# Patient Record
Sex: Female | Born: 1950 | Race: White | Hispanic: No | Marital: Married | Smoking: Former smoker
Health system: Southern US, Community
[De-identification: ages and names within clinical notes are randomized; demographics above are authoritative.]

## PROBLEM LIST (undated history)

## (undated) DIAGNOSIS — M8580 Other specified disorders of bone density and structure, unspecified site: Secondary | ICD-10-CM

## (undated) DIAGNOSIS — I10 Essential (primary) hypertension: Secondary | ICD-10-CM

## (undated) DIAGNOSIS — G43909 Migraine, unspecified, not intractable, without status migrainosus: Secondary | ICD-10-CM

## (undated) DIAGNOSIS — F32A Depression, unspecified: Secondary | ICD-10-CM

## (undated) DIAGNOSIS — H409 Unspecified glaucoma: Secondary | ICD-10-CM

## (undated) DIAGNOSIS — K219 Gastro-esophageal reflux disease without esophagitis: Secondary | ICD-10-CM

## (undated) DIAGNOSIS — E785 Hyperlipidemia, unspecified: Secondary | ICD-10-CM

## (undated) DIAGNOSIS — F329 Major depressive disorder, single episode, unspecified: Secondary | ICD-10-CM

## (undated) DIAGNOSIS — R030 Elevated blood-pressure reading, without diagnosis of hypertension: Secondary | ICD-10-CM

## (undated) DIAGNOSIS — R519 Headache, unspecified: Secondary | ICD-10-CM

## (undated) DIAGNOSIS — F419 Anxiety disorder, unspecified: Secondary | ICD-10-CM

## (undated) DIAGNOSIS — R51 Headache: Secondary | ICD-10-CM

## (undated) DIAGNOSIS — M75100 Unspecified rotator cuff tear or rupture of unspecified shoulder, not specified as traumatic: Secondary | ICD-10-CM

## (undated) DIAGNOSIS — R921 Mammographic calcification found on diagnostic imaging of breast: Secondary | ICD-10-CM

## (undated) HISTORY — DX: Major depressive disorder, single episode, unspecified: F32.9

## (undated) HISTORY — DX: Headache: R51

## (undated) HISTORY — DX: Unspecified glaucoma: H40.9

## (undated) HISTORY — PX: BREAST SURGERY: SHX581

## (undated) HISTORY — PX: FACIAL COSMETIC SURGERY: SHX629

## (undated) HISTORY — DX: Anxiety disorder, unspecified: F41.9

## (undated) HISTORY — DX: Mammographic calcification found on diagnostic imaging of breast: R92.1

## (undated) HISTORY — PX: TUBAL LIGATION: SHX77

## (undated) HISTORY — DX: Other specified disorders of bone density and structure, unspecified site: M85.80

## (undated) HISTORY — DX: Hyperlipidemia, unspecified: E78.5

## (undated) HISTORY — DX: Essential (primary) hypertension: I10

## (undated) HISTORY — DX: Gastro-esophageal reflux disease without esophagitis: K21.9

## (undated) HISTORY — DX: Headache, unspecified: R51.9

## (undated) HISTORY — DX: Unspecified rotator cuff tear or rupture of unspecified shoulder, not specified as traumatic: M75.100

## (undated) HISTORY — PX: NOSE SURGERY: SHX723

## (undated) HISTORY — DX: Migraine, unspecified, not intractable, without status migrainosus: G43.909

## (undated) HISTORY — DX: Elevated blood-pressure reading, without diagnosis of hypertension: R03.0

## (undated) HISTORY — PX: TONSILLECTOMY: SUR1361

## (undated) HISTORY — PX: RHINOPLASTY: SUR1284

## (undated) HISTORY — PX: CLAVICLE SURGERY: SHX598

## (undated) HISTORY — DX: Depression, unspecified: F32.A

---

## 1998-03-08 ENCOUNTER — Ambulatory Visit (HOSPITAL_COMMUNITY): Admission: RE | Admit: 1998-03-08 | Discharge: 1998-03-08 | Payer: Self-pay | Admitting: Gastroenterology

## 1999-01-22 HISTORY — PX: COLONOSCOPY: SHX174

## 2000-06-04 ENCOUNTER — Other Ambulatory Visit: Admission: RE | Admit: 2000-06-04 | Discharge: 2000-06-04 | Payer: Self-pay | Admitting: *Deleted

## 2001-06-09 ENCOUNTER — Other Ambulatory Visit: Admission: RE | Admit: 2001-06-09 | Discharge: 2001-06-09 | Payer: Self-pay | Admitting: *Deleted

## 2001-06-11 ENCOUNTER — Encounter: Payer: Self-pay | Admitting: *Deleted

## 2001-06-11 ENCOUNTER — Encounter: Admission: RE | Admit: 2001-06-11 | Discharge: 2001-06-11 | Payer: Self-pay | Admitting: *Deleted

## 2002-01-20 ENCOUNTER — Encounter: Admission: RE | Admit: 2002-01-20 | Discharge: 2002-01-20 | Payer: Self-pay

## 2003-08-03 ENCOUNTER — Other Ambulatory Visit: Admission: RE | Admit: 2003-08-03 | Discharge: 2003-08-03 | Payer: Self-pay | Admitting: Internal Medicine

## 2004-01-22 HISTORY — PX: COLONOSCOPY: SHX5424

## 2005-01-21 HISTORY — PX: CYSTOSCOPY: SUR368

## 2005-11-26 ENCOUNTER — Ambulatory Visit: Payer: Self-pay | Admitting: Internal Medicine

## 2005-12-06 ENCOUNTER — Ambulatory Visit: Payer: Self-pay | Admitting: Internal Medicine

## 2006-03-25 ENCOUNTER — Ambulatory Visit: Payer: Self-pay | Admitting: Internal Medicine

## 2007-03-16 ENCOUNTER — Ambulatory Visit: Payer: Self-pay | Admitting: Internal Medicine

## 2007-03-16 LAB — CONVERTED CEMR LAB
Bilirubin Urine: NEGATIVE
Blood in Urine, dipstick: NEGATIVE
Glucose, Urine, Semiquant: NEGATIVE
Ketones, urine, test strip: NEGATIVE
Nitrite: NEGATIVE
Protein, U semiquant: NEGATIVE
Specific Gravity, Urine: 1.02
Urobilinogen, UA: NEGATIVE
WBC Urine, dipstick: NEGATIVE
pH: 6.5

## 2007-03-17 ENCOUNTER — Encounter: Payer: Self-pay | Admitting: Internal Medicine

## 2007-03-20 ENCOUNTER — Encounter (INDEPENDENT_AMBULATORY_CARE_PROVIDER_SITE_OTHER): Payer: Self-pay | Admitting: *Deleted

## 2007-03-20 ENCOUNTER — Telehealth (INDEPENDENT_AMBULATORY_CARE_PROVIDER_SITE_OTHER): Payer: Self-pay | Admitting: *Deleted

## 2007-05-27 ENCOUNTER — Telehealth (INDEPENDENT_AMBULATORY_CARE_PROVIDER_SITE_OTHER): Payer: Self-pay | Admitting: *Deleted

## 2007-06-04 ENCOUNTER — Ambulatory Visit: Payer: Self-pay | Admitting: Internal Medicine

## 2007-06-04 DIAGNOSIS — M899 Disorder of bone, unspecified: Secondary | ICD-10-CM | POA: Insufficient documentation

## 2007-06-04 DIAGNOSIS — F411 Generalized anxiety disorder: Secondary | ICD-10-CM | POA: Insufficient documentation

## 2007-06-04 DIAGNOSIS — M949 Disorder of cartilage, unspecified: Secondary | ICD-10-CM

## 2007-06-05 ENCOUNTER — Encounter: Admission: RE | Admit: 2007-06-05 | Discharge: 2007-08-04 | Payer: Self-pay | Admitting: Sports Medicine

## 2007-07-02 ENCOUNTER — Ambulatory Visit: Payer: Self-pay | Admitting: Internal Medicine

## 2007-07-03 ENCOUNTER — Encounter: Payer: Self-pay | Admitting: Internal Medicine

## 2007-07-03 DIAGNOSIS — R1013 Epigastric pain: Secondary | ICD-10-CM

## 2007-07-03 DIAGNOSIS — K3189 Other diseases of stomach and duodenum: Secondary | ICD-10-CM | POA: Insufficient documentation

## 2008-06-08 ENCOUNTER — Ambulatory Visit: Payer: Self-pay | Admitting: Family Medicine

## 2008-07-27 ENCOUNTER — Encounter: Admission: RE | Admit: 2008-07-27 | Discharge: 2008-07-27 | Payer: Self-pay | Admitting: Sports Medicine

## 2008-08-30 ENCOUNTER — Telehealth (INDEPENDENT_AMBULATORY_CARE_PROVIDER_SITE_OTHER): Payer: Self-pay | Admitting: *Deleted

## 2008-10-12 ENCOUNTER — Encounter: Admission: RE | Admit: 2008-10-12 | Discharge: 2008-10-12 | Payer: Self-pay | Admitting: Obstetrics and Gynecology

## 2008-12-14 ENCOUNTER — Ambulatory Visit: Payer: Self-pay | Admitting: Internal Medicine

## 2008-12-14 DIAGNOSIS — F329 Major depressive disorder, single episode, unspecified: Secondary | ICD-10-CM | POA: Insufficient documentation

## 2008-12-14 DIAGNOSIS — M67919 Unspecified disorder of synovium and tendon, unspecified shoulder: Secondary | ICD-10-CM | POA: Insufficient documentation

## 2008-12-14 DIAGNOSIS — E785 Hyperlipidemia, unspecified: Secondary | ICD-10-CM | POA: Insufficient documentation

## 2008-12-14 DIAGNOSIS — M719 Bursopathy, unspecified: Secondary | ICD-10-CM

## 2008-12-14 DIAGNOSIS — R7309 Other abnormal glucose: Secondary | ICD-10-CM | POA: Insufficient documentation

## 2009-03-06 ENCOUNTER — Ambulatory Visit: Payer: Self-pay | Admitting: Internal Medicine

## 2009-03-06 LAB — CONVERTED CEMR LAB
Cholesterol, target level: 200 mg/dL
HDL goal, serum: 40 mg/dL
LDL Goal: 160 mg/dL

## 2010-02-11 ENCOUNTER — Encounter: Payer: Self-pay | Admitting: Internal Medicine

## 2010-02-18 LAB — CONVERTED CEMR LAB
ALT: 21 units/L (ref 0–35)
AST: 28 units/L (ref 0–37)
Albumin: 4.1 g/dL (ref 3.5–5.2)
Albumin: 4.2 g/dL (ref 3.5–5.2)
Alkaline Phosphatase: 105 units/L (ref 39–117)
Alkaline Phosphatase: 53 units/L (ref 39–117)
BUN: 17 mg/dL (ref 6–23)
Basophils Absolute: 0 10*3/uL (ref 0.0–0.1)
Basophils Absolute: 0.1 10*3/uL (ref 0.0–0.1)
Basophils Relative: 1.2 % — ABNORMAL HIGH (ref 0.0–1.0)
Bilirubin, Direct: 0 mg/dL (ref 0.0–0.3)
Bilirubin, Direct: 0.1 mg/dL (ref 0.0–0.3)
CO2: 27 meq/L (ref 19–32)
CO2: 32 meq/L (ref 19–32)
Calcium: 10.5 mg/dL (ref 8.4–10.5)
Calcium: 9.4 mg/dL (ref 8.4–10.5)
Chloride: 101 meq/L (ref 96–112)
Cholesterol: 259 mg/dL (ref 0–200)
Creatinine, Ser: 0.9 mg/dL (ref 0.4–1.2)
Creatinine, Ser: 1 mg/dL (ref 0.4–1.2)
Direct LDL: 145.9 mg/dL
Eosinophils Absolute: 0.2 10*3/uL (ref 0.0–0.7)
Eosinophils Absolute: 0.4 10*3/uL (ref 0.0–0.7)
Eosinophils Relative: 9.5 % — ABNORMAL HIGH (ref 0.0–5.0)
GFR calc Af Amer: 74 mL/min
GFR calc non Af Amer: 61 mL/min
Glucose, Bld: 115 mg/dL — ABNORMAL HIGH (ref 70–99)
Glucose, Bld: 81 mg/dL (ref 70–99)
HCT: 40 % (ref 36.0–46.0)
HDL: 82.2 mg/dL (ref >39.0)
Hemoglobin: 13.4 g/dL (ref 12.0–15.0)
Lymphocytes Relative: 37.1 % (ref 12.0–46.0)
Lymphocytes Relative: 38.4 % (ref 12.0–46.0)
MCHC: 33.5 g/dL (ref 30.0–36.0)
MCHC: 33.6 g/dL (ref 30.0–36.0)
MCV: 97.9 fL (ref 78.0–100.0)
Monocytes Absolute: 0.4 10*3/uL (ref 0.1–1.0)
Monocytes Relative: 8.6 % (ref 3.0–12.0)
Neutro Abs: 1.8 10*3/uL (ref 1.4–7.7)
Neutro Abs: 1.9 10*3/uL (ref 1.4–7.7)
Neutrophils Relative %: 43.6 % (ref 43.0–77.0)
Neutrophils Relative %: 45.1 % (ref 43.0–77.0)
Platelets: 193 10*3/uL (ref 150.0–400.0)
Platelets: 244 10*3/uL (ref 150–400)
Potassium: 4.3 meq/L (ref 3.5–5.1)
RBC: 4.08 M/uL (ref 3.87–5.11)
RDW: 12.6 % (ref 11.5–14.6)
RDW: 12.7 % (ref 11.5–14.6)
Sodium: 138 meq/L (ref 135–145)
TSH: 0.91 microintl units/mL (ref 0.35–5.50)
Total Bilirubin: 0.8 mg/dL (ref 0.3–1.2)
Total CHOL/HDL Ratio: 3.2
Total Protein: 7.3 g/dL (ref 6.0–8.3)
Triglycerides: 72 mg/dL (ref 0–149)
VLDL: 14 mg/dL (ref 0–40)
Vit D, 1,25-Dihydroxy: 65 (ref 30–89)
WBC: 4.4 10*3/uL — ABNORMAL LOW (ref 4.5–10.5)

## 2010-02-20 NOTE — Assessment & Plan Note (Signed)
Summary: TO DISCUSS HER LIPO LABS//PH   Vital Signs:  Patient profile:   60 year old female Weight:      155 pounds Pulse rate:   60 / minute Resp:     14 per minute BP sitting:   134 / 80  (left arm) Cuff size:   regular  Vitals Entered By: Shonna Chock (March 06, 2009 11:30 AM) CC: Follow-up visit: Discuss Chlosterol Profile, Lipid Management Comments REVIEWED MED LIST, PATIENT AGREED DOSE AND INSTRUCTION CORRECT    CC:  Follow-up visit: Discuss Chlosterol Profile and Lipid Management.  History of Present Illness: NMR Lipoprofile  reviewed & risk discussed. Her father, an intermittent smoker, died @ 56 of MI. She is on low carb diet. CVE as cardio  almost once daily for 40-60 min  w/o symptoms for 3 weeks.  Lipid Management History:      Positive NCEP/ATP III risk factors include female age 49 years old or older and family history for ischemic heart disease (males less than 79 years old).  Negative NCEP/ATP III risk factors include non-diabetic, HDL cholesterol greater than 60, non-tobacco-user status, non-hypertensive, no ASHD (atherosclerotic heart disease), no prior stroke/TIA, no peripheral vascular disease, and no history of aortic aneurysm.     Allergies (verified): No Known Drug Allergies  Past History:  Past Medical History: Borderline BP, PMH of  Anxiety/Depression, Dr Evelene Croon Osteopenia Fasting hyperglycemia Hyperlipidemia: NMR 11/2008: LDL 045(4098/11), HDL 100, TG 102 . LDL goal = < 120, ideally < 100. Postmenopausal  Review of Systems CV:  Denies chest pain or discomfort, leg cramps with exertion, palpitations, shortness of breath with exertion, and swelling of feet; Hand edema overnight.  Physical Exam  General:  Appears younger than age ,well-nourished, alert,appropriate and cooperative throughout examination Lungs:  Normal respiratory effort, chest expands symmetrically. Lungs are clear to auscultation, no crackles or wheezes. Heart:  Normal rate and  regular rhythm. S1 and S2 normal without gallop, murmur, click, rub. S4 Pulses:  R and L carotid,radial,dorsalis pedis and posterior tibial pulses are full and equal bilaterally Extremities:  No clubbing, cyanosis, edema.   Impression & Recommendations:  Problem # 1:  HYPERLIPIDEMIA (ICD-272.4)  Problem # 2:  ATHEROSCLEROTIC HEART DISEASE, FAMILY HX (ICD-V17.3)  Complete Medication List: 1)  Trazodone Hcl 50 Mg Tabs (Trazodone hcl) .... Take 2 tabs once daily 2)  Lorazepam 1 Mg Tabs (Lorazepam) .... 1/2 to one tab once daily as needed only 3)  Clarinex 5 Mg Tabs (Desloratadine) .Marland Kitchen.. 1 by mouth qd 4)  Deplin 7.5 Mg Tabs (L-methylfolate) .... Qd 5)  Evamist  6)  Glucosamine and Chondritin  7)  Lexapro 20 Mg Tabs (Escitalopram oxalate) .Marland Kitchen.. 1 by mouth once daily 8)  Prometrium 100 Mg Caps (Progesterone micronized)  Lipid Assessment/Plan:      Based on NCEP/ATP III, the patient's risk factor category is "0-1 risk factors".  The patient's lipid goals are as follows: Total cholesterol goal is 200; LDL cholesterol goal is 160; HDL cholesterol goal is 40; Triglyceride goal is 150.    Patient Instructions: 1)  Continue CVE ; review Dr Gildardo Griffes Eat, Drink & Be Healthy. 2)  Please schedule a follow-up appointment in 6 months. 3)   C Reactive Protein & Lipid Panel prior to visit, ICD-9:272.4, V17.3

## 2010-04-06 ENCOUNTER — Encounter: Payer: Self-pay | Admitting: Internal Medicine

## 2010-04-06 ENCOUNTER — Encounter (INDEPENDENT_AMBULATORY_CARE_PROVIDER_SITE_OTHER): Payer: BC Managed Care – PPO | Admitting: Internal Medicine

## 2010-04-06 DIAGNOSIS — M899 Disorder of bone, unspecified: Secondary | ICD-10-CM

## 2010-04-06 DIAGNOSIS — Z23 Encounter for immunization: Secondary | ICD-10-CM

## 2010-04-06 DIAGNOSIS — E785 Hyperlipidemia, unspecified: Secondary | ICD-10-CM

## 2010-04-06 DIAGNOSIS — Z0000 Encounter for general adult medical examination without abnormal findings: Secondary | ICD-10-CM

## 2010-04-06 DIAGNOSIS — Z8249 Family history of ischemic heart disease and other diseases of the circulatory system: Secondary | ICD-10-CM
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
## 2010-04-19 ENCOUNTER — Other Ambulatory Visit (INDEPENDENT_AMBULATORY_CARE_PROVIDER_SITE_OTHER): Payer: BC Managed Care – PPO

## 2010-04-19 DIAGNOSIS — Z8249 Family history of ischemic heart disease and other diseases of the circulatory system: Secondary | ICD-10-CM

## 2010-04-19 DIAGNOSIS — Z0000 Encounter for general adult medical examination without abnormal findings: Secondary | ICD-10-CM

## 2010-04-19 DIAGNOSIS — E785 Hyperlipidemia, unspecified: Secondary | ICD-10-CM

## 2010-04-19 LAB — CBC WITH DIFFERENTIAL/PLATELET
Basophils Absolute: 0 10*3/uL (ref 0.0–0.1)
Eosinophils Absolute: 0.2 10*3/uL (ref 0.0–0.7)
Eosinophils Relative: 3.1 % (ref 0.0–5.0)
MCV: 99.2 fl (ref 78.0–100.0)
Monocytes Absolute: 0.7 10*3/uL (ref 0.1–1.0)
Neutrophils Relative %: 57.1 % (ref 43.0–77.0)
Platelets: 234 10*3/uL (ref 150.0–400.0)
WBC: 6.5 10*3/uL (ref 4.5–10.5)

## 2010-05-03 ENCOUNTER — Encounter: Payer: Self-pay | Admitting: Internal Medicine

## 2010-05-26 ENCOUNTER — Encounter: Payer: Self-pay | Admitting: Internal Medicine

## 2010-05-28 ENCOUNTER — Ambulatory Visit (INDEPENDENT_AMBULATORY_CARE_PROVIDER_SITE_OTHER): Payer: BC Managed Care – PPO | Admitting: Internal Medicine

## 2010-05-28 ENCOUNTER — Encounter: Payer: Self-pay | Admitting: Internal Medicine

## 2010-05-28 VITALS — BP 122/84 | HR 80 | Wt 158.2 lb

## 2010-05-28 DIAGNOSIS — E785 Hyperlipidemia, unspecified: Secondary | ICD-10-CM

## 2010-05-28 NOTE — Progress Notes (Signed)
  Subjective:    Patient ID: Molly Hubbard, female    DOB: [birth year], 60 y.o.   MRN: [id number]  HPI Dyslipidemia assessment: Lab results: [facility] Panel reviewed : major risks are LDL 174, Lp(a) 44, increased small dense LDL.   Prior Advanced Lipid Testing: NMR Lipoprofile 2010: LDL 191(2190/98), HDL 100, TG 102. .   Family history of premature CAD/ MI: father MI @ 6, smoker . Nutrition: heart healthy .  Exercise: to start . Diabetes : no .  Smoking history  : quit age 35 .  HTN: no  .   Weight :  5# gain in past year. ROS: fatigue: no ; chest pain : no ;claudication: no; palpitations: no; abd pain/bowel changes: no ; myalgias:no;  syncope : no ; memory loss: no;skin changes: no.     Review of Systems Occasional postural head pressure.     Objective:   Physical Exam Heart:  Normal rate and regular rhythm. S1 and S2 normal without gallop, murmur, click, rub or other extra sounds.Lungs:Chest clear to auscultation; no wheezes, rhonchi,rales ,or rubs present.No increased work of breathing.  All pulses intact without  bruits .No ischemic skin changes.          Assessment & Plan:  #1 dyslipidemia; this context of premature myocardial infarction  her father  The major risk at this time his LDL greater than 130. There are minor issues related HDL; exercise program recommended  to address that. Her LP( a)  44 is a genetic risk. The only option would be niacin. This would be problematic because of flushing.  Her small dense percentage of LDL particles as increased; the elevation of the triglyceride suggests that this is more nutritional.  Plan: Options at this time would be low-dose statin or a therapeutic life style program of exercise and nutrition change (decreased high  fructose corn syrup and" heart healthy diet") with routine fasting lipids after 4-6 months.

## 2010-05-28 NOTE — Patient Instructions (Addendum)
Preventive Health Care: Exercise  30-45  minutes a day, 3-4 days a week. Walking is especially valuable in preventing Osteoporosis. Eat a low-fat diet with lots of fruits and vegetables, up to 7-9 servings per day. Avoid obesity; your goal = waist less than 35 inches.Consume less than 30 grams of sugar per day from foods & drinks with High Fructose Corn Syrup as #2,3 or #4 on label. If pavastatin taken ; check fasting labs in 10 weeks: Lipids, hepatic panel.(272.4,V17.3, 995.20)

## 2010-05-28 NOTE — Assessment & Plan Note (Signed)
NMR Lipoprofile 2010: LDL 191( 2190/98), HDL 100, TG 102.Minimal  LDL goal = < 130, ideally < 100. Father MI @ 52

## 2010-08-14 ENCOUNTER — Ambulatory Visit (INDEPENDENT_AMBULATORY_CARE_PROVIDER_SITE_OTHER): Payer: BC Managed Care – PPO | Admitting: Internal Medicine

## 2010-08-14 ENCOUNTER — Encounter: Payer: Self-pay | Admitting: Internal Medicine

## 2010-08-14 VITALS — BP 130/80 | HR 76 | Temp 98.1°F | Wt 156.2 lb

## 2010-08-14 DIAGNOSIS — R635 Abnormal weight gain: Secondary | ICD-10-CM

## 2010-08-14 DIAGNOSIS — R3915 Urgency of urination: Secondary | ICD-10-CM

## 2010-08-14 DIAGNOSIS — N390 Urinary tract infection, site not specified: Secondary | ICD-10-CM

## 2010-08-14 LAB — POCT URINALYSIS DIPSTICK
Blood, UA: NEGATIVE
Glucose, UA: NEGATIVE
Nitrite, UA: NEGATIVE
Spec Grav, UA: 1.015
Urobilinogen, UA: 0.2
pH, UA: 5

## 2010-08-14 MED ORDER — NITROFURANTOIN MONOHYD MACRO 100 MG PO CAPS: 100.0000 mg | ORAL_CAPSULE | Freq: Two times a day (BID) | ORAL | Status: AC

## 2010-08-14 NOTE — Patient Instructions (Signed)
Eat a low-fat diet with lots of fruits and vegetables, up to 7-9 servings per day. Avoid obesity; your goal is waist measurement < 40 inches.Consume less than 40 grams of sugar per day from foods & drinks with High Fructose Corn Sugar as #1,2,3 or # 4 on label. Follow the low carb nutrition program in [employer] as closely as possible to prevent Diabetes progression & complications. White carbohydrates (potatoes, rice, bread, and pasta) have a high spike of sugar and a high load of sugar. For example a  baked potato has a cup of sugar and a  french fry  2 teaspoons of sugar. Yams, wild  rice, whole grained bread &  wheat pasta have been much lower spike and load of  sugar. Portions should be the size of a deck of cards or your palm.  

## 2010-08-14 NOTE — Progress Notes (Signed)
  Subjective:    Patient ID: Molly Hubbard, female    DOB: [birth year], 60 y.o.   MRN: [id number]  HPI Urinary symptoms: Onset: 7/20 as urgency    Worsening: better with Azo   Symptoms Frequency: yes,   Hesitancy: no,   Hematuria:no Flank Pain: no  Fever: no    Nausea/Vomiting: no   Discharge: no  Red Flags  : (Risk Factors for Complicated UTI) Recent Antibiotic Usage (last 30 days): no  More than 3 UTI's last 12 months: no  PMH of  1. DM: no 2. Renal Disease/Calculi: no 3. Urinary Tract Abnormality: no  4. Instrumentation/Trauma: no 5. Immunosuppression: no      Review of Systems     Objective:   Physical Exam General appearance is one of good health and nourishment w/o distress.  Eyes: No conjunctival inflammation or scleral icterus is present.   Heart:  Normal rate and regular rhythm. S1 and S2 normal without gallop, murmur, click, rub or other extra sounds     Lungs:Chest clear to auscultation; no wheezes, rhonchi,rales ,or rubs present.No increased work of breathing.   Abdomen: bowel sounds normal, soft ; minimally tender suprapubic  Area, without masses, organomegaly or hernias noted.  No guarding or rebound . No flank tenderness; negative straight leg raising.  Skin:Warm & dry.  Intact without suspicious lesions or rashes ; no jaundice   Lymphatic: No lymphadenopathy is noted about the head, neck, axilla, or inguinal areas.                Assessment & Plan:  #1 urinary frequency; urinalysis is normal.  #2 concerns about weight; she inquired about diet pills  Plan: #1 Macrobid 100 mg twice a day pending return of the urine  culture.  #2 discuss the potential adverse effects of diet pills; she could consider [facility]. The old chart will be reviewed to assess thyroid function status

## 2010-08-14 NOTE — Progress Notes (Signed)
Addended byPecola Lawless on: 08/14/2010 04:55 PM   Modules accepted: Orders

## 2010-08-15 ENCOUNTER — Ambulatory Visit: Payer: BC Managed Care – PPO | Admitting: Internal Medicine

## 2010-08-16 LAB — URINE CULTURE: Organism ID, Bacteria: NO GROWTH

## 2010-09-03 ENCOUNTER — Other Ambulatory Visit: Payer: Self-pay | Admitting: Internal Medicine

## 2011-03-31 ENCOUNTER — Other Ambulatory Visit: Payer: Self-pay | Admitting: Internal Medicine

## 2011-04-01 ENCOUNTER — Other Ambulatory Visit: Payer: Self-pay | Admitting: Internal Medicine

## 2011-04-01 NOTE — Telephone Encounter (Signed)
Prescription sent to pharmacy.  Patient is due for labwork and office visit.

## 2011-04-01 NOTE — Telephone Encounter (Signed)
This is a duplicate request.  Already done.

## 2012-03-09 ENCOUNTER — Other Ambulatory Visit: Payer: Self-pay | Admitting: Internal Medicine

## 2012-03-10 NOTE — Telephone Encounter (Signed)
Patient needs Lipid/Hep 272.4/995.20 and OV 2-3 days later

## 2012-03-16 ENCOUNTER — Ambulatory Visit (INDEPENDENT_AMBULATORY_CARE_PROVIDER_SITE_OTHER): Payer: BC Managed Care – PPO | Admitting: Internal Medicine

## 2012-03-16 ENCOUNTER — Encounter: Payer: Self-pay | Admitting: Internal Medicine

## 2012-03-16 VITALS — BP 138/88 | HR 103 | Temp 97.4°F | Resp 18 | Ht 66.0 in | Wt 157.0 lb

## 2012-03-16 DIAGNOSIS — Z808 Family history of malignant neoplasm of other organs or systems: Secondary | ICD-10-CM | POA: Insufficient documentation

## 2012-03-16 DIAGNOSIS — E785 Hyperlipidemia, unspecified: Secondary | ICD-10-CM

## 2012-03-16 DIAGNOSIS — F329 Major depressive disorder, single episode, unspecified: Secondary | ICD-10-CM

## 2012-03-16 DIAGNOSIS — Z780 Asymptomatic menopausal state: Secondary | ICD-10-CM | POA: Insufficient documentation

## 2012-03-16 DIAGNOSIS — Z8249 Family history of ischemic heart disease and other diseases of the circulatory system: Secondary | ICD-10-CM

## 2012-03-16 DIAGNOSIS — H409 Unspecified glaucoma: Secondary | ICD-10-CM

## 2012-03-16 DIAGNOSIS — F411 Generalized anxiety disorder: Secondary | ICD-10-CM

## 2012-03-16 LAB — CBC WITH DIFFERENTIAL/PLATELET
Basophils Absolute: 0.1 10*3/uL (ref 0.0–0.1)
Basophils Relative: 2 % — ABNORMAL HIGH (ref 0–1)
Eosinophils Relative: 3 % (ref 0–5)
HCT: 39.6 % (ref 36.0–46.0)
MCH: 32.2 pg (ref 26.0–34.0)
MCHC: 33.6 g/dL (ref 30.0–36.0)
MCV: 95.9 fL (ref 78.0–100.0)
Monocytes Absolute: 0.4 10*3/uL (ref 0.1–1.0)
RDW: 13.6 % (ref 11.5–15.5)

## 2012-03-16 LAB — COMPREHENSIVE METABOLIC PANEL
AST: 42 U/L — ABNORMAL HIGH (ref 0–37)
Alkaline Phosphatase: 72 U/L (ref 39–117)
BUN: 16 mg/dL (ref 6–23)
Calcium: 10 mg/dL (ref 8.4–10.5)
Creat: 0.85 mg/dL (ref 0.50–1.10)

## 2012-03-16 LAB — LIPID PANEL
HDL: 135 mg/dL (ref >39)
Total CHOL/HDL Ratio: 2.2 Ratio
Triglycerides: 69 mg/dL (ref <150)

## 2012-03-16 LAB — TSH: TSH: 1.074 u[IU]/mL (ref 0.350–4.500)

## 2012-03-16 NOTE — Patient Instructions (Addendum)
See me as CPE
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
## 2012-03-17 ENCOUNTER — Telehealth: Payer: Self-pay | Admitting: *Deleted

## 2012-03-17 NOTE — Telephone Encounter (Signed)
Message copied by Mathews Robinsons on Tue Mar 17, 2012  8:47 AM ------      Message from: Raechel Chute D      Created: Tue Mar 17, 2012  8:39 AM       Karen Kitchens             See pts glucose.   She has new onset diabetes.   Call Orlie Pollen and tell her that her sugar is very high - give her an appt to see me WEds or Thursday            Add a HGB AIC to labs done yesterday ------

## 2012-03-17 NOTE — Telephone Encounter (Signed)
Left message awaiting return call

## 2012-03-18 ENCOUNTER — Ambulatory Visit (INDEPENDENT_AMBULATORY_CARE_PROVIDER_SITE_OTHER): Payer: BC Managed Care – PPO | Admitting: Internal Medicine

## 2012-03-18 ENCOUNTER — Encounter: Payer: Self-pay | Admitting: Internal Medicine

## 2012-03-18 VITALS — BP 140/88 | HR 99 | Temp 97.4°F | Resp 16 | Wt 157.0 lb

## 2012-03-18 DIAGNOSIS — R7309 Other abnormal glucose: Secondary | ICD-10-CM

## 2012-03-18 DIAGNOSIS — R7303 Prediabetes: Secondary | ICD-10-CM

## 2012-03-18 DIAGNOSIS — R739 Hyperglycemia, unspecified: Secondary | ICD-10-CM

## 2012-03-18 DIAGNOSIS — R7989 Other specified abnormal findings of blood chemistry: Secondary | ICD-10-CM

## 2012-03-18 LAB — HEMOGLOBIN A1C: Mean Plasma Glucose: 117 mg/dL — ABNORMAL HIGH (ref <117)

## 2012-03-18 LAB — GLUCOSE, POCT (MANUAL RESULT ENTRY): POC Glucose: 126 mg/dl — AB (ref 70–99)

## 2012-03-18 NOTE — Progress Notes (Signed)
Subjective:    Patient ID: Molly Hubbard, female    DOB: 07-08-1950, 62 y.o.   MRN: [id number]  HPI Molly Hubbard is here for follow up.  See labs.  Molly Hubbard reports thirst and increased urination "but I have always been like this"    Random fingerstick glucose is 126 in office today  Further history she reports she drinks 5-6 glasses of wine or cocktails every evening.  Her spouse does not drink she tells me.  She knows she needs to cut back. I note in new pt history she also reports using marijuana as well.   See lipids.  She tells me she is taking her Pravachol  See lfts  Likely combined ETOH and statin use  No Known Allergies Past Medical History  Diagnosis Date  . Borderline hypertension   . Anxiety   . Depression   . Hypertension   . Hyperlipidemia    Past Surgical History  Procedure Laterality Date  . Clavicle surgery      @ 19  . Nose surgery      Fractured from baseball injury   . Tubal ligation    . Colonoscopy  2006  . Colonoscopy  2001    Done for positive stool cards   . Tonsillectomy    . Cystoscopy  2007    Dr.MacDiarmid  . Breast surgery    . Facial cosmetic surgery      face lift  . Rhinoplasty     History   Social History  . Marital Status: Married    Spouse Name: N/A    Number of Children: N/A  . Years of Education: N/A   Occupational History  . Not on file.   Social History Main Topics  . Smoking status: Former Smoker    Quit date: 01/22/1988  . Smokeless tobacco: Not on file  . Alcohol Use: 0.0 oz/week    2-3 Glasses of wine per week     Comment: 2-3 daily  . Drug Use: No  . Sexually Active: Yes   Other Topics Concern  . Not on file   Social History Narrative  . No narrative on file   Family History  Problem Relation Age of Onset  . Melanoma Sister   . Stroke Mother 27  . Heart attack Father 36   Patient Active Problem List  Diagnosis  . HYPERLIPIDEMIA  . ANXIETY STATE, UNSPECIFIED  . DEPRESSION  . DYSPEPSIA  . ROTATOR CUFF  SYNDROME, LEFT  . OSTEOPENIA  . FASTING HYPERGLYCEMIA  . Glaucoma  . Menopause  . Family history of melanoma  . Family history of MI (myocardial infarction)   Current Outpatient Prescriptions on File Prior to Visit  Medication Sig Dispense Refill  . aspirin 81 MG tablet Take 81 mg by mouth 2 (two) times daily.      . Coenzyme Q10 (CO Q 10) 10 MG CAPS Take 1 tablet by mouth daily.      . Dietary Management Product (ENLYTE PO) Take 1 tablet by mouth daily.      . dorzolamide (TRUSOPT) 2 % ophthalmic solution Place 1 drop into both eyes 2 (two) times daily.      Marland Kitchen escitalopram (LEXAPRO) 20 MG tablet Take 20 mg by mouth daily.        . Estradiol (EVAMIST TD) Place onto the skin.        . fish oil-omega-3 fatty acids 1000 MG capsule Take 2 g by mouth daily.      Marland Kitchen  latanoprost (XALATAN) 0.005 % ophthalmic solution 1 drop at bedtime.      Marland Kitchen LORazepam (ATIVAN) 1 MG tablet Take 1 mg by mouth as directed. 1/2 by mouth once daily as needed       . Multiple Minerals TABS Take 1 tablet by mouth daily.      . niacin 100 MG tablet Take 100 mg by mouth daily with breakfast.      . omeprazole (PRILOSEC) 20 MG capsule Take 20 mg by mouth daily.        . pravastatin (PRAVACHOL) 20 MG tablet TAKE 1 TABLET BY MOUTH EVERY NIGHT AT BEDTIME  90 tablet  0  . pravastatin (PRAVACHOL) 20 MG tablet TAKE 1 TABLET BY MOUTH EVERY NIGHT AT BEDTIME  90 tablet  1  . progesterone (PROMETRIUM) 100 MG capsule Take 100 mg by mouth daily.        . traZODone (DESYREL) 50 MG tablet Take 50 mg by mouth as directed. Take 2 by mouth once daily       . Zinc 50 MG CAPS Take 1 capsule by mouth daily.       No current facility-administered medications on file prior to visit.       Review of Systems See HPI    Objective:   Physical Exam  Physical Exam  Nursing note and vitals reviewed.  Constitutional: She is oriented to person, place, and time. She appears well-developed and well-nourished.  HENT:  Head: Normocephalic  and atraumatic.  Cardiovascular: Normal rate and regular rhythm. Exam reveals no gallop and no friction rub.  No murmur heard.  Pulmonary/Chest: Breath sounds normal. She has no wheezes. She has no rales.  Neurological: She is alert and oriented to person, place, and time.  Skin: Skin is warm and dry.  Psychiatric: She has a normal mood and affect. Her behavior is normal.            Assessment & Plan:  Borderline diabetes. :  Pt would like to change diet before trying meds.  Long counselling session of avoiding sugars and limiting ETOH to 1 or 1.5 glasses daily.  Will refer to Diabetes center.  She is to come back for a fasting glucose and I will get AIC today  Elevated LFT  Likely combined effect of statin and ETOH  .  Counseled above  Hyperlipidemia  Will not increase statin until ETOH issues  And diabetes under control.    To return in 1-2 days for fasting glucose.  Further management based on results along with [facility] results.

## 2012-03-18 NOTE — Patient Instructions (Addendum)
To return for fasting glucose    Referred to diabetes center

## 2012-03-20 ENCOUNTER — Telehealth: Payer: Self-pay | Admitting: *Deleted

## 2012-03-20 ENCOUNTER — Encounter: Payer: Self-pay | Admitting: *Deleted

## 2012-03-20 NOTE — Telephone Encounter (Signed)
Message copied by Mathews Robinsons on Fri Mar 20, 2012  8:40 AM ------      Message from: Raechel Chute D      Created: Thu Mar 19, 2012  8:00 AM       Karen Kitchens             Call pt and let her know that her HGB AIC  Is borderline as I expected.  Counsel to be sure to keep appt at diabetes center,  Cut back on nightly alcohol (as we discussed in office) and given her an appt to see me office in 4-6 weeks.  Message back with appt time            Ok to mail labs to her ------

## 2012-03-20 NOTE — Telephone Encounter (Signed)
Left message awaiting return call

## 2012-04-07 ENCOUNTER — Other Ambulatory Visit: Payer: Self-pay | Admitting: Internal Medicine

## 2012-04-08 ENCOUNTER — Encounter: Payer: Self-pay | Admitting: Dietician

## 2012-04-08 ENCOUNTER — Encounter: Payer: BC Managed Care – PPO | Attending: Internal Medicine | Admitting: Dietician

## 2012-04-08 VITALS — Ht 66.0 in | Wt 158.6 lb

## 2012-04-08 DIAGNOSIS — Z713 Dietary counseling and surveillance: Secondary | ICD-10-CM | POA: Insufficient documentation

## 2012-04-08 DIAGNOSIS — R7309 Other abnormal glucose: Secondary | ICD-10-CM | POA: Insufficient documentation

## 2012-04-08 DIAGNOSIS — R7303 Prediabetes: Secondary | ICD-10-CM

## 2012-04-08 NOTE — Progress Notes (Signed)
Diabetes Self-Management Education  Visit Number: First/Initial  04/08/2012 Ms. Molly Hubbard, identified by name and date of birth, is a 62 y.o. female with Diabetes Type: Pre-Diabetes.  Other people present during visit:  Other (comment)   ASSESSMENT  Patient Concerns:  Nutrition/Meal planning;Monitoring;Weight Control  Height 5\' 6"  (1.676 m), weight 158 lb 9.6 oz (71.94 kg). Body mass index is 25.61 kg/(m^2).  Lab Results: LDL Cholesterol  Date Value Range Status  03/16/2012 150* 0 - 99 mg/dL Final           Total Cholesterol/HDL Ratio:CHD Risk                            Coronary Heart Disease Risk Table                                            Men       Women              1/2 Average Risk              3.4        3.3                  Average Risk              5.0        4.4               2X Average Risk              9.6        7.1               3X Average Risk             23.4       11.0     Use the calculated Patient Ratio above and the CHD Risk table      to determine the patient's CHD Risk.     ATP III Classification (LDL):           < 100        mg/dL         Optimal          100 - 129     mg/dL         Near or Above Optimal          130 - 159     mg/dL         Borderline High          160 - 189     mg/dL         High           > 190        mg/dL         Very High           Hemoglobin A1C  Date Value Range Status  03/18/2012 5.7* <5.7 % Final  According to the ADA Clinical Practice Recommendations for 2011, when     HbA1c is used as a screening test:             >=6.5%   Diagnostic of Diabetes Mellitus                (if abnormal result is confirmed)           5.7-6.4%   Increased risk of developing Diabetes Mellitus           References:Diagnosis and Classification of Diabetes Mellitus,Diabetes     Care,2011,34(Suppl 1):S62-S69 and Standards of Medical Care in             Diabetes  - 2011,Diabetes Care,2011,34 (Suppl 1):S11-S61.           Family History  Problem Relation Age of Onset  . Melanoma Sister   . Stroke Mother 47  . Heart attack Father 82   History  Substance Use Topics  . Smoking status: Former Smoker    Quit date: 01/22/1988  . Smokeless tobacco: Not on file  . Alcohol Use: 0.0 oz/week    2-3 Glasses of wine per week     Comment: 2-3 daily    Support Systems:  Spouse Special Needs:  None  Prior DM Education:   Daily Foot Exams: No Patient Belief / Attitude about Diabetes:  Diabetes can be controlled  Assessment comments: New onset pre-diabetes with A1C at 5.7 and an average blood glucose of 117 mg.  She had a fasting glucose at the time of 144 mg.  Has HDL at 135 mg and LDL at 150 mg with a Total Chol at 299 mg.  TG at 69 mg.  She has started the Wheat Bell Diet and is about losing weight and not developing full blown diabetes.       Diet Recall:   Reports that she has been doing the Wheat Belly Diet for the last 3 weeks.  Feels that she has more energy using this non grain approach.   7:00 AM: Water 2 (8 oz glasses)  Followed with 2 cups of coffee about 16 oz that has Stevia and half and half or coffee mate creamer 8:30: Uses Naked fruit juice 4 oz and adds frozen straw berries and blueberries.  9:30 Has eith a spaghetti chili or Spaghetti squash with chili or Chili with a baked sweet potato with butter and sour cream. 12:30-1:00 PM with have eggs 2 fried in olive oil or nuts or Hamberger patty with goat cheese and a sweet potato.  Will drink water with shredded frozen lemon sweetened with Evangeline Dakin  Drinks this throughout the day.  OR will have a gluten free chicken breast on a lettuce wrap. 6:30-7:00 PM will have steak or salmon and asparagus or other green vegetable and =/- a salad.    Individualized Plan for Diabetes Self-Management Training:  Continue to limit your carb intake. Plan to utilize the non-starchy veggies and the lean  proteins at all meals and snacks. Use your beans and legumes for protein and starch sources. Monitor portions of the starches you do use. Aim to increase your exercise time.  Walk the dogs further .  Education Topics Reviewed with Patient Today:  Topic Points Discussed  Disease State Definition of diabetes, type 1 and 2, and the diagnosis of diabetes  Nutrition Management Role of diet in the treatment of diabetes and the relationship between the three main macronutrients and blood glucose level;Food label reading, portion  sizes and measuring food.;Carbohydrate counting  Physical Activity and Exercise  Completed review of the importance of exercise activity for glucose control and weight loss  Medications    Monitoring    Acute Complications    Chronic Complications    Psychosocial Adjustment    Goal Setting    Preconception Care (if applicable)      PATIENTS GOALS   Learning Objective(s):     Goal The patient agrees to:  Nutrition  Continue to read labels and count carbs.    Physical Activity  increase daily physical activity.  Medications  Avoid going on medications for controlling blood glucose   Monitoring    Problem Solving    Reducing Risk    Health Coping     ______________________________________________________________________  Outcomes  Expected Outcomes:     Self-Care Barriers:      Education material provided: Living Well with Diabetes.   Resource of Calorie[employer].com web site.  Yellow card with a diet prescription of 15-30 gm of CHO per meal with 15 gm for snacks and increased use of lean protein sources.   If problems or questions, patient to contact team via:  Phone and Email  Time in: 1145     Time out: 1245  Future DSME appointment: Molly Hubbard 04/08/2012 2:31 PM

## 2012-04-14 ENCOUNTER — Other Ambulatory Visit: Payer: Self-pay | Admitting: *Deleted

## 2012-04-14 NOTE — Telephone Encounter (Signed)
Refill request

## 2012-04-16 MED ORDER — PRAVASTATIN SODIUM 20 MG PO TABS: 20.0000 mg | ORAL_TABLET | Freq: Every day | ORAL | Status: DC

## 2012-04-16 NOTE — Telephone Encounter (Signed)
Molly Hubbard regarding her Cholesterol medication and appt

## 2012-04-16 NOTE — Telephone Encounter (Signed)
Molly Hubbard  Call this pt and let her know that I refilled her Pravachol for 30 days only.  Her liver function was elevated and she needs to have it rechecked in my office.    Give her a 30 min appt and come in fasting  Message back with appt date

## 2012-05-20 ENCOUNTER — Other Ambulatory Visit: Payer: Self-pay | Admitting: Internal Medicine

## 2012-05-20 NOTE — Telephone Encounter (Signed)
Refill request

## 2012-05-21 ENCOUNTER — Encounter: Payer: Self-pay | Admitting: Internal Medicine

## 2012-05-21 DIAGNOSIS — R7989 Other specified abnormal findings of blood chemistry: Secondary | ICD-10-CM | POA: Insufficient documentation

## 2012-05-21 DIAGNOSIS — R945 Abnormal results of liver function studies: Secondary | ICD-10-CM

## 2012-05-21 NOTE — Telephone Encounter (Signed)
Notified pt about change in medication and explained need for fasting lbs before her CPE pt voiced understanding will print lab order and leave at front desk for pt to pick up

## 2012-05-21 NOTE — Telephone Encounter (Signed)
Bobbie  Call pt and let her know that I lowered her dose of Pravachol to 10 mg as her liver function tests were elevated.  I gave her 30 days worth of 10 mg.  She is to come in to office before her CPE in June to have fasting lipids,  CMP and all CPE labs done  She has a CPE appt in June

## 2012-06-10 ENCOUNTER — Telehealth: Payer: Self-pay | Admitting: *Deleted

## 2012-06-16 NOTE — Telephone Encounter (Signed)
Notified ms Lavery that mother was in our office and her assistance was needed

## 2012-07-01 ENCOUNTER — Other Ambulatory Visit: Payer: Self-pay | Admitting: *Deleted

## 2012-07-01 ENCOUNTER — Telehealth: Payer: Self-pay | Admitting: *Deleted

## 2012-07-01 DIAGNOSIS — E119 Type 2 diabetes mellitus without complications: Secondary | ICD-10-CM

## 2012-07-02 ENCOUNTER — Encounter: Payer: Self-pay | Admitting: *Deleted

## 2012-07-02 NOTE — Telephone Encounter (Signed)
error 

## 2012-07-06 ENCOUNTER — Encounter: Payer: Self-pay | Admitting: Internal Medicine

## 2012-07-06 ENCOUNTER — Ambulatory Visit (HOSPITAL_BASED_OUTPATIENT_CLINIC_OR_DEPARTMENT_OTHER)
Admission: RE | Admit: 2012-07-06 | Discharge: 2012-07-06 | Disposition: A | Discharge status: Home / Self Care | Payer: BC Managed Care – PPO | Source: Ambulatory Visit | Attending: Internal Medicine | Admitting: Internal Medicine

## 2012-07-06 ENCOUNTER — Ambulatory Visit (INDEPENDENT_AMBULATORY_CARE_PROVIDER_SITE_OTHER): Payer: BC Managed Care – PPO | Admitting: Internal Medicine

## 2012-07-06 VITALS — BP 124/79 | HR 98 | Temp 97.2°F | Resp 18 | Ht 66.0 in | Wt 150.0 lb

## 2012-07-06 DIAGNOSIS — H409 Unspecified glaucoma: Secondary | ICD-10-CM

## 2012-07-06 DIAGNOSIS — Z780 Asymptomatic menopausal state: Secondary | ICD-10-CM

## 2012-07-06 DIAGNOSIS — Z139 Encounter for screening, unspecified: Secondary | ICD-10-CM

## 2012-07-06 DIAGNOSIS — N951 Menopausal and female climacteric states: Secondary | ICD-10-CM

## 2012-07-06 DIAGNOSIS — E785 Hyperlipidemia, unspecified: Secondary | ICD-10-CM

## 2012-07-06 DIAGNOSIS — Z0000 Encounter for general adult medical examination without abnormal findings: Secondary | ICD-10-CM

## 2012-07-06 DIAGNOSIS — Z1231 Encounter for screening mammogram for malignant neoplasm of breast: Secondary | ICD-10-CM | POA: Insufficient documentation

## 2012-07-06 DIAGNOSIS — M899 Disorder of bone, unspecified: Secondary | ICD-10-CM

## 2012-07-06 DIAGNOSIS — R7989 Other specified abnormal findings of blood chemistry: Secondary | ICD-10-CM

## 2012-07-06 DIAGNOSIS — R7309 Other abnormal glucose: Secondary | ICD-10-CM

## 2012-07-06 LAB — POCT URINALYSIS DIPSTICK
Bilirubin, UA: NEGATIVE
Blood, UA: NEGATIVE
Nitrite, UA: NEGATIVE
pH, UA: 7

## 2012-07-06 NOTE — Progress Notes (Signed)
Subjective:    Patient ID: Molly Hubbard, female    DOB: 26-Jan-1950, 62 y.o.   MRN: [id number]  HPI Molly Hubbard is here for CPE   Overall doing well.    See labs    Elevated LFt's  Molly Hubbard admits to drinking 5-6 glasses of wine nightly for many years.  She also smokes marijuana to help her glaucoma.  She had seen Dr. Elmer Picker recently  Elevated glucose  Subsequent random glucoses normal.  AIC two days ago have normalized  She has not had a mm in quite some time.  Molly Hubbard does have breast implants  She has not taken her Pravachol for her elevated cholesterol.    She has an appt with her GYN  Dr. Dareen Piano for her pap later this week  No Known Allergies Past Medical History  Diagnosis Date  . Borderline hypertension   . Anxiety   . Depression   . Hypertension   . Hyperlipidemia    Past Surgical History  Procedure Laterality Date  . Clavicle surgery      @ 19  . Nose surgery      Fractured from baseball injury   . Tubal ligation    . Colonoscopy  2006  . Colonoscopy  2001    Done for positive stool cards   . Tonsillectomy    . Cystoscopy  2007    Dr.MacDiarmid  . Breast surgery    . Facial cosmetic surgery      face lift  . Rhinoplasty    . Cesarean section     History   Social History  . Marital Status: Married    Spouse Name: N/A    Number of Children: N/A  . Years of Education: N/A   Occupational History  . Not on file.   Social History Main Topics  . Smoking status: Former Smoker    Quit date: 01/22/1988  . Smokeless tobacco: Not on file  . Alcohol Use: 0.0 oz/week    2-3 Glasses of wine per week     Comment: 2-3 daily  . Drug Use: Yes    Special: Marijuana  . Sexually Active: Yes   Other Topics Concern  . Not on file   Social History Narrative  . No narrative on file   Family History  Problem Relation Age of Onset  . Melanoma Sister   . Stroke Mother 38  . Heart attack Father 59   Patient Active Problem List   Diagnosis Date Noted  .  Elevated LFTs 05/21/2012  . Glaucoma 03/16/2012  . Menopause 03/16/2012  . Family history of melanoma 03/16/2012  . Family history of MI (myocardial infarction) 03/16/2012  . HYPERLIPIDEMIA 12/14/2008  . DEPRESSION 12/14/2008  . ROTATOR CUFF SYNDROME, LEFT 12/14/2008  . FASTING HYPERGLYCEMIA 12/14/2008  . DYSPEPSIA 07/03/2007  . ANXIETY STATE, UNSPECIFIED 06/04/2007  . OSTEOPENIA 06/04/2007   Current Outpatient Prescriptions on File Prior to Visit  Medication Sig Dispense Refill  . aspirin 81 MG tablet Take 81 mg by mouth 2 (two) times daily.      . Coenzyme Q10 (CO Q 10) 10 MG CAPS Take 1 tablet by mouth daily.      Marland Kitchen escitalopram (LEXAPRO) 20 MG tablet Take 20 mg by mouth daily.        . Estradiol (EVAMIST TD) Place onto the skin.        . fish oil-omega-3 fatty acids 1000 MG capsule Take 2 g by mouth daily.      Marland Kitchen  latanoprost (XALATAN) 0.005 % ophthalmic solution 1 drop at bedtime.      . Multiple Minerals TABS Take 1 tablet by mouth daily.      . niacin 100 MG tablet Take 100 mg by mouth daily with breakfast.      . omeprazole (PRILOSEC) 20 MG capsule Take 20 mg by mouth daily.        . progesterone (PROMETRIUM) 100 MG capsule Take 100 mg by mouth daily.        . traZODone (DESYREL) 50 MG tablet Take 50 mg by mouth as directed. Take 2 by mouth once daily       . Dietary Management Product (ENLYTE PO) Take 1 tablet by mouth daily.      Marland Kitchen LORazepam (ATIVAN) 1 MG tablet Take 1 mg by mouth as directed. 1/2 by mouth once daily as needed       . pravastatin (PRAVACHOL) 20 MG tablet Take 0.5 tablets (10 mg total) by mouth daily.  30 tablet  0  . Zinc 50 MG CAPS Take 1 capsule by mouth daily.       No current facility-administered medications on file prior to visit.      Review of Systems  All other systems reviewed and are negative.       Objective:   Physical Exam Physical Exam  Nursing note and vitals reviewed.  Constitutional: She is oriented to person, place, and time.  She appears well-developed and well-nourished.  HENT:  Head: Normocephalic and atraumatic.  Right Ear: Tympanic membrane and ear canal normal. No drainage. Tympanic membrane is not injected and not erythematous.  Left Ear: Tympanic membrane and ear canal normal. No drainage. Tympanic membrane is not injected and not erythematous.  Nose: Nose normal. Right sinus exhibits no maxillary sinus tenderness and no frontal sinus tenderness. Left sinus exhibits no maxillary sinus tenderness and no frontal sinus tenderness.  Mouth/Throat: Oropharynx is clear and moist. No oral lesions. No oropharyngeal exudate.  Eyes: Conjunctivae and EOM are normal. Pupils are equal, round, and reactive to light.  Neck: Normal range of motion. Neck supple. No JVD present. Carotid bruit is not present. No mass and no thyromegaly present.  Cardiovascular: Normal rate, regular rhythm, S1 normal, S2 normal and intact distal pulses. Exam reveals no gallop and no friction rub.  No murmur heard.  Pulses:  Carotid pulses are 2+ on the right side, and 2+ on the left side.  Dorsalis pedis pulses are 2+ on the right side, and 2+ on the left side.  No carotid bruit. No LE edema  Pulmonary/Chest: Breath sounds normal. She has no wheezes. She has no rales. She exhibits no tenderness. Breast  Implants in place   No discrete masses no nipple discharge no axillary adenoapthy bilaterally Abdominal: Soft. Bowel sounds are normal. She exhibits no distension and no mass. There is no hepatosplenomegaly. There is no tenderness. There is no CVA tenderness.  Musculoskeletal: Normal range of motion.  No active synovitis to joints.  Lymphadenopathy:  She has no cervical adenopathy.  She has no axillary adenopathy.  Right: No inguinal and no supraclavicular adenopathy present.  Left: No inguinal and no supraclavicular adenopathy present.  Neurological: She is alert and oriented to person, place, and time. She has normal strength and normal  reflexes. She displays no tremor. No cranial nerve deficit or sensory deficit. Coordination and gait normal.  Skin: Skin is warm and dry. No rash noted. No cyanosis. Nails show no clubbing.  Psychiatric: She has  a normal mood and affect. Her speech is normal and behavior is normal. Cognition and memory are normal.           Assessment & Plan:  Health maintenance  Will get mm today.  Pap at GYN later this week   Fasting labs this weeek  Elevated glucose: glucoses have normalized  Advised low carb diet  Elevated LFT"S  Long discussion regarding risks of ETOH.  ADvised to reduce ETOH to 2 glasses of wine daily.  Will check lfts today.  Pt.  States  "I know I need to cut back"  Hyperlipdemia  Will check fasting  Levels.   I am hesitant  To recommend statin given ETOH use  FH melanoma  Pt had dermatologist  Glaucoma  Managed by Dr. Elmer Picker.     See me in 3 months

## 2012-07-06 NOTE — Patient Instructions (Addendum)
See me in 3 months

## 2012-07-08 ENCOUNTER — Telehealth: Payer: Self-pay | Admitting: *Deleted

## 2012-07-08 LAB — LIPID PANEL

## 2012-07-08 LAB — COMPREHENSIVE METABOLIC PANEL

## 2012-07-08 NOTE — Telephone Encounter (Signed)
LVM message to return call  

## 2012-07-10 ENCOUNTER — Other Ambulatory Visit: Payer: Self-pay | Admitting: *Deleted

## 2012-07-10 DIAGNOSIS — E785 Hyperlipidemia, unspecified: Secondary | ICD-10-CM

## 2012-07-10 LAB — COMPREHENSIVE METABOLIC PANEL
ALT: 43 U/L — ABNORMAL HIGH (ref 0–35)
Alkaline Phosphatase: 59 U/L (ref 39–117)
CO2: 26 mEq/L (ref 19–32)
Creat: 0.73 mg/dL (ref 0.50–1.10)
Sodium: 133 mEq/L — ABNORMAL LOW (ref 135–145)
Total Bilirubin: 0.6 mg/dL (ref 0.3–1.2)
Total Protein: 7 g/dL (ref 6.0–8.3)

## 2012-07-10 LAB — LIPID PANEL
HDL: 105 mg/dL (ref >39)
LDL Cholesterol: 155 mg/dL — ABNORMAL HIGH (ref 0–99)
Total CHOL/HDL Ratio: 2.6 Ratio
Triglycerides: 80 mg/dL (ref <150)

## 2012-07-15 ENCOUNTER — Telehealth: Payer: Self-pay | Admitting: *Deleted

## 2012-07-21 ENCOUNTER — Other Ambulatory Visit: Payer: Self-pay | Admitting: Internal Medicine

## 2012-07-21 ENCOUNTER — Telehealth: Payer: Self-pay | Admitting: *Deleted

## 2012-07-21 NOTE — Telephone Encounter (Signed)
LVM message for pt to return call regarding appt for labs.

## 2012-07-27 ENCOUNTER — Other Ambulatory Visit: Payer: Self-pay | Admitting: *Deleted

## 2012-07-27 NOTE — Telephone Encounter (Signed)
Refill request

## 2012-07-28 ENCOUNTER — Encounter: Payer: Self-pay | Admitting: Internal Medicine

## 2012-07-28 ENCOUNTER — Ambulatory Visit (INDEPENDENT_AMBULATORY_CARE_PROVIDER_SITE_OTHER): Payer: BC Managed Care – PPO | Admitting: Internal Medicine

## 2012-07-28 VITALS — BP 128/77 | HR 85 | Temp 97.0°F | Resp 18

## 2012-07-28 DIAGNOSIS — R7989 Other specified abnormal findings of blood chemistry: Secondary | ICD-10-CM

## 2012-07-28 DIAGNOSIS — F1290 Cannabis use, unspecified, uncomplicated: Secondary | ICD-10-CM | POA: Insufficient documentation

## 2012-07-28 DIAGNOSIS — E785 Hyperlipidemia, unspecified: Secondary | ICD-10-CM

## 2012-07-28 DIAGNOSIS — F1210 Cannabis abuse, uncomplicated: Secondary | ICD-10-CM

## 2012-07-28 DIAGNOSIS — F1020 Alcohol dependence, uncomplicated: Secondary | ICD-10-CM

## 2012-07-28 NOTE — Progress Notes (Signed)
Subjective:    Patient ID: Molly Hubbard, female    DOB: 1950-10-11, 62 y.o.   MRN: [id number]  HPI  Molly Hubbard is here for follow up.    See labs.  She tells me she drinks 4-6 glasses of "hard liquor" daily.  She also has occasional marijauana use.  She knows she need to stop driniking.  Spouse does not drink  She states she just got back from the beach where she could not play golf but "ate and drank"  No Known Allergies Past Medical History  Diagnosis Date  . Borderline hypertension   . Anxiety   . Depression   . Hypertension   . Hyperlipidemia    Past Surgical History  Procedure Laterality Date  . Clavicle surgery      @ 19  . Nose surgery      Fractured from baseball injury   . Tubal ligation    . Colonoscopy  2006  . Colonoscopy  2001    Done for positive stool cards   . Tonsillectomy    . Cystoscopy  2007    Dr.MacDiarmid  . Breast surgery    . Facial cosmetic surgery      face lift  . Rhinoplasty    . Cesarean section     History   Social History  . Marital Status: Married    Spouse Name: N/A    Number of Children: N/A  . Years of Education: N/A   Occupational History  . Not on file.   Social History Main Topics  . Smoking status: Former Smoker    Quit date: 01/22/1988  . Smokeless tobacco: Not on file  . Alcohol Use: 0.0 oz/week    2-3 Glasses of wine per week     Comment: 2-3 daily  . Drug Use: Yes    Special: Marijuana  . Sexually Active: Yes   Other Topics Concern  . Not on file   Social History Narrative  . No narrative on file   Family History  Problem Relation Age of Onset  . Melanoma Sister   . Cancer Sister     melanoma  . Stroke Mother 35  . Heart attack Father 82   Patient Active Problem List   Diagnosis Date Noted  . Alcohol dependence 07/28/2012  . Elevated LFTs 05/21/2012  . Glaucoma 03/16/2012  . Menopause 03/16/2012  . Family history of melanoma 03/16/2012  . Family history of MI (myocardial infarction)  03/16/2012  . HYPERLIPIDEMIA 12/14/2008  . DEPRESSION 12/14/2008  . ROTATOR CUFF SYNDROME, LEFT 12/14/2008  . FASTING HYPERGLYCEMIA 12/14/2008  . DYSPEPSIA 07/03/2007  . ANXIETY STATE, UNSPECIFIED 06/04/2007  . OSTEOPENIA 06/04/2007   Current Outpatient Prescriptions on File Prior to Visit  Medication Sig Dispense Refill  . aspirin 81 MG tablet Take 81 mg by mouth 2 (two) times daily.      . Coenzyme Q10 (CO Q 10) 10 MG CAPS Take 1 tablet by mouth daily.      . Dietary Management Product (ENLYTE PO) Take 1 tablet by mouth daily.      Marland Kitchen escitalopram (LEXAPRO) 20 MG tablet Take 20 mg by mouth daily.        . Estradiol (EVAMIST TD) Place onto the skin.        . fish oil-omega-3 fatty acids 1000 MG capsule Take 2 g by mouth daily.      Marland Kitchen latanoprost (XALATAN) 0.005 % ophthalmic solution 1 drop at bedtime.      Marland Kitchen  Multiple Minerals TABS Take 1 tablet by mouth daily.      . niacin 100 MG tablet Take 100 mg by mouth daily with breakfast.      . omeprazole (PRILOSEC) 20 MG capsule Take 20 mg by mouth daily.        . progesterone (PROMETRIUM) 100 MG capsule Take 100 mg by mouth daily.        . traZODone (DESYREL) 50 MG tablet Take 50 mg by mouth as directed. Take 2 by mouth once daily       . LORazepam (ATIVAN) 1 MG tablet Take 1 mg by mouth as directed. 1/2 by mouth once daily as needed        No current facility-administered medications on file prior to visit.      Review of Systems See HPI    Objective:   Physical Exam Physical Exam  Nursing note and vitals reviewed.  Constitutional: She is oriented to person, place, and time. She appears well-developed and well-nourished.  HENT:  Head: Normocephalic and atraumatic.  Cardiovascular: Normal rate and regular rhythm. Exam reveals no gallop and no friction rub.  No murmur heard.  Pulmonary/Chest: Breath sounds normal. She has no wheezes. She has no rales.  Neurological: She is alert and oriented to person, place, and time.  Skin:  Skin is warm and dry.  Psychiatric: She has a normal mood and affect. Her behavior is normal.             Assessment & Plan:  Transaminitis  Likely due to  ETOH use.  ADvised to cut back to 1 or 2 drinks daily.  I tried to compromise with pt that if she can cut back on her use,  Will recheck lfts in 3 months.  ETOH dependence see above  She also uses intermittant marijuana.  She does not wish 12 step recovery program  Hyperlipidemia:  Will daily excessive ETOH use and dependence,  Risk of statin side effects may outweigh benefit.  Will recheck in 3 months after pt is to cut back on her use and make decision then about continuing Pravachol  See me in 3 mlonths

## 2012-09-08 NOTE — Telephone Encounter (Signed)
appt made

## 2012-10-12 ENCOUNTER — Ambulatory Visit: Payer: BC Managed Care – PPO | Admitting: Internal Medicine

## 2013-02-08 ENCOUNTER — Telehealth: Payer: Self-pay | Admitting: *Deleted

## 2013-02-08 NOTE — Telephone Encounter (Signed)
Pt states that she went to have botox this am and her BP was elevated will make an appt

## 2013-02-09 ENCOUNTER — Ambulatory Visit (INDEPENDENT_AMBULATORY_CARE_PROVIDER_SITE_OTHER): Payer: 59 | Admitting: Internal Medicine

## 2013-02-09 ENCOUNTER — Encounter: Payer: Self-pay | Admitting: Internal Medicine

## 2013-02-09 VITALS — BP 137/87 | HR 80 | Temp 98.1°F | Resp 18 | Wt 159.0 lb

## 2013-02-09 DIAGNOSIS — R209 Unspecified disturbances of skin sensation: Secondary | ICD-10-CM

## 2013-02-09 DIAGNOSIS — R200 Anesthesia of skin: Secondary | ICD-10-CM

## 2013-02-09 DIAGNOSIS — IMO0001 Reserved for inherently not codable concepts without codable children: Secondary | ICD-10-CM

## 2013-02-09 DIAGNOSIS — R030 Elevated blood-pressure reading, without diagnosis of hypertension: Secondary | ICD-10-CM

## 2013-02-09 NOTE — Progress Notes (Signed)
   Subjective:    Patient ID: Molly Hubbard, female    DOB: 10/22/1950, 63 y.o.   MRN: [id number]  HPI [name] is here for acute visit.    She was getting a Botox injection yesterday and was told her BP was 180.  When checked a few minutes later SBP came down to 150 per her report  Had some ringing in ears.     No other symptoms  She was taking a lot of sudafed for a cold  Report both hands are numb in am.   No chest pain or pressure  She is on computer quite a bit   Review of Systems See HPI    Objective:   Physical Exam Physical Exam  Nursing note and vitals reviewed.  Repeat BP  130/78 Constitutional: She is oriented to person, place, and time. She appears well-developed and well-nourished.  HENT:  Head: Normocephalic and atraumatic.  Tm's  Serous effusions Cardiovascular: Normal rate and regular rhythm. Exam reveals no gallop and no friction rub.  No murmur heard.  Pulmonary/Chest: Breath sounds normal. She has no wheezes. She has no rales.  Neurological: She is alert and oriented to person, place, and time.  Skin: Skin is warm and dry. Hand  tinels and phalens negative Psychiatric: She has a normal mood and affect. Her behavior is normal.         Assessment & Plan:  Elevated BP  Likely due to sudafed.  Normal now.  Avoid decongestants  OK for plain mucinex OTC  Hand numbness may be carpal tunnel .   She does not want splints.  See me as needed

## 2013-02-09 NOTE — Patient Instructions (Addendum)
Get Mucinex  NOt D  For congestion

## 2013-04-05 ENCOUNTER — Encounter (HOSPITAL_BASED_OUTPATIENT_CLINIC_OR_DEPARTMENT_OTHER): Payer: Self-pay | Admitting: Emergency Medicine

## 2013-04-05 ENCOUNTER — Telehealth: Payer: Self-pay | Admitting: Internal Medicine

## 2013-04-05 ENCOUNTER — Emergency Department (HOSPITAL_BASED_OUTPATIENT_CLINIC_OR_DEPARTMENT_OTHER)
Admission: EM | Admit: 2013-04-05 | Discharge: 2013-04-05 | Disposition: A | Discharge status: Home / Self Care | Payer: 59 | Attending: Emergency Medicine | Admitting: Emergency Medicine

## 2013-04-05 ENCOUNTER — Telehealth: Payer: Self-pay | Admitting: *Deleted

## 2013-04-05 ENCOUNTER — Emergency Department (HOSPITAL_BASED_OUTPATIENT_CLINIC_OR_DEPARTMENT_OTHER): Payer: 59

## 2013-04-05 DIAGNOSIS — E785 Hyperlipidemia, unspecified: Secondary | ICD-10-CM | POA: Insufficient documentation

## 2013-04-05 DIAGNOSIS — Z87891 Personal history of nicotine dependence: Secondary | ICD-10-CM | POA: Insufficient documentation

## 2013-04-05 DIAGNOSIS — F411 Generalized anxiety disorder: Secondary | ICD-10-CM | POA: Insufficient documentation

## 2013-04-05 DIAGNOSIS — F329 Major depressive disorder, single episode, unspecified: Secondary | ICD-10-CM | POA: Insufficient documentation

## 2013-04-05 DIAGNOSIS — F3289 Other specified depressive episodes: Secondary | ICD-10-CM | POA: Insufficient documentation

## 2013-04-05 DIAGNOSIS — I10 Essential (primary) hypertension: Secondary | ICD-10-CM

## 2013-04-05 DIAGNOSIS — R002 Palpitations: Secondary | ICD-10-CM

## 2013-04-05 DIAGNOSIS — Z7982 Long term (current) use of aspirin: Secondary | ICD-10-CM | POA: Insufficient documentation

## 2013-04-05 DIAGNOSIS — Z79899 Other long term (current) drug therapy: Secondary | ICD-10-CM | POA: Insufficient documentation

## 2013-04-05 LAB — CBC
HEMATOCRIT: 39.5 % (ref 36.0–46.0)
HEMOGLOBIN: 13.6 g/dL (ref 12.0–15.0)
MCH: 33.7 pg (ref 26.0–34.0)
MCHC: 34.4 g/dL (ref 30.0–36.0)
MCV: 97.8 fL (ref 78.0–100.0)
Platelets: 192 10*3/uL (ref 150–400)
RBC: 4.04 MIL/uL (ref 3.87–5.11)
RDW: 13.8 % (ref 11.5–15.5)
WBC: 5.7 10*3/uL (ref 4.0–10.5)

## 2013-04-05 LAB — BASIC METABOLIC PANEL
BUN: 15 mg/dL (ref 6–23)
CALCIUM: 10.6 mg/dL — AB (ref 8.4–10.5)
CO2: 27 mEq/L (ref 19–32)
Chloride: 93 mEq/L — ABNORMAL LOW (ref 96–112)
Creatinine, Ser: 0.7 mg/dL (ref 0.50–1.10)
GFR calc Af Amer: >90 mL/min (ref >90)
Glucose, Bld: 104 mg/dL — ABNORMAL HIGH (ref 70–99)
POTASSIUM: 3.8 meq/L (ref 3.7–5.3)
Sodium: 137 mEq/L (ref 137–147)

## 2013-04-05 LAB — TROPONIN I: Troponin I: <0.3 ng/mL (ref <0.30)

## 2013-04-05 MED ORDER — DILTIAZEM HCL ER COATED BEADS 360 MG PO CP24: 360.0000 mg | ORAL_CAPSULE | Freq: Every day | ORAL | Status: DC

## 2013-04-05 NOTE — Telephone Encounter (Signed)
Anxious, feel heart beating in neck and ears when she awakes, chest feels tight, feels like her BP is high.

## 2013-04-05 NOTE — ED Notes (Signed)
Pt. Is playing on her I pad at present time with no distress noted.  Pt. Has no noted resp. Distress or shortness of breath.

## 2013-04-05 NOTE — ED Notes (Signed)
Patient transported to X-ray 

## 2013-04-05 NOTE — ED Notes (Signed)
MD at bedside discussing results. 

## 2013-04-05 NOTE — ED Provider Notes (Addendum)
CSN: [id number]     Arrival date & time 04/05/13  1148 History   First MD Initiated Contact with Patient 04/05/13 1203     Chief Complaint  Patient presents with  . Palpitations     (Consider location/radiation/quality/duration/timing/severity/associated sxs/prior Treatment) Patient is a 63 y.o. female presenting with palpitations. The history is provided by the patient.  Palpitations  patient here complaining of palpitations that have been persistent for the past 3 days that are worse in the mornings. She does drink 3 cups of coffee a day. This is her normal consumption. She also notes associated chest pressure with this. Notes associated dyspnea without diaphoresis. Symptoms have been intermittent and nothing seems to make him better worse.She denies any leg pain or swelling. Denies any cough or congestion. No syncope or near-syncope. She did use a Xanax prior to arrival because of her history of anxiety without response.  Past Medical History  Diagnosis Date  . Borderline hypertension   . Anxiety   . Depression   . Hypertension   . Hyperlipidemia    Past Surgical History  Procedure Laterality Date  . Clavicle surgery      @ 19  . Nose surgery      Fractured from baseball injury   . Tubal ligation    . Colonoscopy  2006  . Colonoscopy  2001    Done for positive stool cards   . Tonsillectomy    . Cystoscopy  2007    Dr.MacDiarmid  . Breast surgery    . Facial cosmetic surgery      face lift  . Rhinoplasty    . Cesarean section     Family History  Problem Relation Age of Onset  . Melanoma Sister   . Cancer Sister     melanoma  . Stroke Mother 108  . Heart attack Father 63   History  Substance Use Topics  . Smoking status: Former Smoker    Quit date: 01/22/1988  . Smokeless tobacco: Not on file  . Alcohol Use: 0.0 oz/week    2-3 Glasses of wine per week     Comment: 2-3 daily   OB History   Grav Para Term Preterm Abortions TAB SAB Ect Mult Living   3    1  1    2      Review of Systems  Cardiovascular: Positive for palpitations.  All other systems reviewed and are negative.      Allergies  Review of patient's allergies indicates no known allergies.  Home Medications   Current Outpatient Rx  Name  Route  Sig  Dispense  Refill  . aspirin 81 MG tablet   Oral   Take 81 mg by mouth 2 (two) times daily.         . Coenzyme Q10 (CO Q 10) 10 MG CAPS   Oral   Take 1 tablet by mouth daily.         . Dietary Management Product (ENLYTE PO)   Oral   Take 1 tablet by mouth daily.         Marland Kitchen escitalopram (LEXAPRO) 20 MG tablet   Oral   Take 20 mg by mouth daily.           . Estradiol (EVAMIST TD)   Transdermal   Place onto the skin.           . fish oil-omega-3 fatty acids 1000 MG capsule   Oral   Take 2 g by mouth daily.         Marland Kitchen  latanoprost (XALATAN) 0.005 % ophthalmic solution      1 drop at bedtime.         Marland Kitchen LORazepam (ATIVAN) 1 MG tablet   Oral   Take 1 mg by mouth as directed. 1/2 by mouth once daily as needed          . Multiple Minerals TABS   Oral   Take 1 tablet by mouth daily.         . niacin 100 MG tablet   Oral   Take 100 mg by mouth daily with breakfast.         . omeprazole (PRILOSEC) 20 MG capsule   Oral   Take 20 mg by mouth daily.           . progesterone (PROMETRIUM) 100 MG capsule   Oral   Take 100 mg by mouth daily.           . traZODone (DESYREL) 50 MG tablet   Oral   Take 50 mg by mouth as directed. Take 2 by mouth once daily           BP 158/86  Pulse 99  Temp(Src) 98.4 F (36.9 C) (Oral)  Resp 16  Ht 5\' 6"  (1.676 m)  Wt 150 lb (68.04 kg)  BMI 24.22 kg/m2  SpO2 98% Physical Exam  Nursing note and vitals reviewed. Constitutional: She is oriented to person, place, and time. She appears well-developed and well-nourished.  Non-toxic appearance. No distress.  HENT:  Head: Normocephalic and atraumatic.  Eyes: Conjunctivae, EOM and lids are normal. Pupils are  equal, round, and reactive to light.  Neck: Normal range of motion. Neck supple. No tracheal deviation present. No mass present.  Cardiovascular: Normal rate, regular rhythm and normal heart sounds.  Exam reveals no gallop.   No murmur heard. Pulmonary/Chest: Effort normal and breath sounds normal. No stridor. No respiratory distress. She has no decreased breath sounds. She has no wheezes. She has no rhonchi. She has no rales.  Abdominal: Soft. Normal appearance and bowel sounds are normal. She exhibits no distension. There is no tenderness. There is no rebound and no CVA tenderness.  Musculoskeletal: Normal range of motion. She exhibits no edema and no tenderness.  Neurological: She is alert and oriented to person, place, and time. She has normal strength. No cranial nerve deficit or sensory deficit. GCS eye subscore is 4. GCS verbal subscore is 5. GCS motor subscore is 6.  Skin: Skin is warm and dry. No abrasion and no rash noted.  Psychiatric: She has a normal mood and affect. Her speech is normal and behavior is normal.    ED Course  Procedures (including critical care time) Labs Review Labs Reviewed  TROPONIN I  CBC  BASIC METABOLIC PANEL   Imaging Review No results found.   EKG Interpretation   Date/Time:  Monday April 05 2013 11:58:18 EDT Ventricular Rate:  94 PR Interval:  150 QRS Duration: 76 QT Interval:  344 QTC Calculation: 430 R Axis:   70 Text Interpretation:  Normal sinus rhythm Right atrial enlargement  Possible Anterior infarct , age undetermined Abnormal ECG Confirmed by  Zenia Resides  MD, Serina Nichter ([id number]) on 04/05/2013 12:19:55 PM      MDM   Final diagnoses:  None   Spoke with patient's primary care physician today and discuss with her the patient's laboratory studies. Patient now states that she feels that the Xanax as she did prior to arrival has helped. She said to her chest  discomfort was persistent for several hours. Do not think that this represents ACS. No  concern for pulmonary embolism. Patient has subjective palpitations while I monitored here in the department and there was no rhythm change. Patient has mild hypertension here and she requested that the patient be placed on a Cardizem 360 mg a day. And she will see the patient in followup     Leota Jacobsen, MD 04/05/13 1411  Leota Jacobsen, MD 04/05/13 [phone]

## 2013-04-05 NOTE — Telephone Encounter (Signed)
Molly Hubbard  Call pt and give her 30 min appt at 11:00 thursday

## 2013-04-05 NOTE — Discharge Instructions (Signed)
Palpitations  A palpitation is the feeling that your heartbeat is irregular or is faster than normal. It may feel like your heart is fluttering or skipping a beat. Palpitations are usually not a serious problem. However, in some cases, you may need further medical evaluation. CAUSES  Palpitations can be caused by:  Smoking.  Caffeine or other stimulants, such as diet pills or energy drinks.  Alcohol.  Stress and anxiety.  Strenuous physical activity.  Fatigue.  Certain medicines.  Heart disease, especially if you have a history of arrhythmias. This includes atrial fibrillation, atrial flutter, or supraventricular tachycardia.  An improperly working pacemaker or defibrillator. DIAGNOSIS  To find the cause of your palpitations, your caregiver will take your history and perform a physical exam. Tests may also be done, including:  Electrocardiography (ECG). This test records the heart's electrical activity.  Cardiac monitoring. This allows your caregiver to monitor your heart rate and rhythm in real time.  Holter monitor. This is a portable device that records your heartbeat and can help diagnose heart arrhythmias. It allows your caregiver to track your heart activity for several days, if needed.  Stress tests by exercise or by giving medicine that makes the heart beat faster. TREATMENT  Treatment of palpitations depends on the cause of your symptoms and can vary greatly. Most cases of palpitations do not require any treatment other than time, relaxation, and monitoring your symptoms. Other causes, such as atrial fibrillation, atrial flutter, or supraventricular tachycardia, usually require further treatment. HOME CARE INSTRUCTIONS   Avoid:  Caffeinated coffee, tea, soft drinks, diet pills, and energy drinks.  Chocolate.  Alcohol.  Stop smoking if you smoke.  Reduce your stress and anxiety. Things that can help you relax include:  A method that measures bodily functions so  you can learn to control them (biofeedback).  Yoga.  Meditation.  Physical activity such as swimming, jogging, or walking.  Get plenty of rest and sleep. SEEK MEDICAL CARE IF:   You continue to have a fast or irregular heartbeat beyond 24 hours.  Your palpitations occur more often. SEEK IMMEDIATE MEDICAL CARE IF:  You develop chest pain or shortness of breath.  You have a severe headache.  You feel dizzy, or you faint. MAKE SURE YOU:  Understand these instructions.  Will watch your condition.  Will get help right away if you are not doing well or get worse. Document Released: 01/05/2000 Document Revised: 05/04/2012 Document Reviewed: 03/08/2011 [facility] Patient Information 2014 [place]. Hypertension As your heart beats, it forces blood through your arteries. This force is your blood pressure. If the pressure is too high, it is called hypertension (HTN) or high blood pressure. HTN is dangerous because you may have it and not know it. High blood pressure may mean that your heart has to work harder to pump blood. Your arteries may be narrow or stiff. The extra work puts you at risk for heart disease, stroke, and other problems.  Blood pressure consists of two numbers, a higher number over a lower, 110/72, for example. It is stated as "110 over 72." The ideal is below 120 for the top number (systolic) and under 80 for the bottom (diastolic). Write down your blood pressure today. You should pay close attention to your blood pressure if you have certain conditions such as:  Heart failure.  Prior heart attack.  Diabetes  Chronic kidney disease.  Prior stroke.  Multiple risk factors for heart disease. To see if you have HTN, your blood pressure  should be measured while you are seated with your arm held at the level of the heart. It should be measured at least twice. A one-time elevated blood pressure reading (especially in the Emergency Department) does not mean that you  need treatment. There may be conditions in which the blood pressure is different between your right and left arms. It is important to see your caregiver soon for a recheck. Most people have essential hypertension which means that there is not a specific cause. This type of high blood pressure may be lowered by changing lifestyle factors such as:  Stress.  Smoking.  Lack of exercise.  Excessive weight.  Drug/tobacco/alcohol use.  Eating less salt. Most people do not have symptoms from high blood pressure until it has caused damage to the body. Effective treatment can often prevent, delay or reduce that damage. TREATMENT  When a cause has been identified, treatment for high blood pressure is directed at the cause. There are a large number of medications to treat HTN. These fall into several categories, and your caregiver will help you select the medicines that are best for you. Medications may have side effects. You should review side effects with your caregiver. If your blood pressure stays high after you have made lifestyle changes or started on medicines,   Your medication(s) may need to be changed.  Other problems may need to be addressed.  Be certain you understand your prescriptions, and know how and when to take your medicine.  Be sure to follow up with your caregiver within the time frame advised (usually within two weeks) to have your blood pressure rechecked and to review your medications.  If you are taking more than one medicine to lower your blood pressure, make sure you know how and at what times they should be taken. Taking two medicines at the same time can result in blood pressure that is too low. SEEK IMMEDIATE MEDICAL CARE IF:  You develop a severe headache, blurred or changing vision, or confusion.  You have unusual weakness or numbness, or a faint feeling.  You have severe chest or abdominal pain, vomiting, or breathing problems. MAKE SURE YOU:   Understand  these instructions.  Will watch your condition.  Will get help right away if you are not doing well or get worse. Document Released: 01/07/2005 Document Revised: 04/01/2011 Document Reviewed: 08/28/2007 [facility] Patient Information 2014 [place].

## 2013-04-05 NOTE — ED Notes (Signed)
Palpitations x 3 days. Sob at times. Drinks 3 cups of coffee every am.

## 2013-04-08 ENCOUNTER — Encounter: Payer: Self-pay | Admitting: Internal Medicine

## 2013-04-08 ENCOUNTER — Ambulatory Visit (INDEPENDENT_AMBULATORY_CARE_PROVIDER_SITE_OTHER): Payer: 59 | Admitting: Internal Medicine

## 2013-04-08 VITALS — BP 151/83 | HR 90 | Temp 98.0°F | Resp 18 | Wt 155.0 lb

## 2013-04-08 DIAGNOSIS — H9319 Tinnitus, unspecified ear: Secondary | ICD-10-CM

## 2013-04-08 DIAGNOSIS — R002 Palpitations: Secondary | ICD-10-CM

## 2013-04-08 LAB — COMPREHENSIVE METABOLIC PANEL
ALT: 79 U/L — ABNORMAL HIGH (ref 0–35)
AST: 100 U/L — AB (ref 0–37)
Albumin: 4.5 g/dL (ref 3.5–5.2)
Alkaline Phosphatase: 69 U/L (ref 39–117)
BUN: 16 mg/dL (ref 6–23)
CHLORIDE: 92 meq/L — AB (ref 96–112)
CO2: 29 mEq/L (ref 19–32)
CREATININE: 0.74 mg/dL (ref 0.50–1.10)
Calcium: 10.1 mg/dL (ref 8.4–10.5)
GLUCOSE: 91 mg/dL (ref 70–99)
Potassium: 4.7 mEq/L (ref 3.5–5.3)
Sodium: 133 mEq/L — ABNORMAL LOW (ref 135–145)
Total Bilirubin: 0.6 mg/dL (ref 0.2–1.2)
Total Protein: 7.2 g/dL (ref 6.0–8.3)

## 2013-04-08 LAB — TSH: TSH: 0.703 u[IU]/mL (ref 0.350–4.500)

## 2013-04-08 MED ORDER — ATENOLOL 25 MG PO TABS: 25.0000 mg | ORAL_TABLET | Freq: Every day | ORAL | Status: DC

## 2013-04-08 NOTE — Progress Notes (Signed)
Subjective:    Patient ID: Molly Hubbard, female    DOB: [birth year], 63 y.o.   MRN: [id number]  HPI Molly Hubbard is here for ER follow up.  Evaluated in ER  3/16 for palpitations.  Bp 158/86 at that time  She also was quite anxious at that time and had taken a Xanax prior to arrival.  See ER notes.  Sister in [place] is dying of metastatic melanoma   EKG RAE insignificant q inferiorlaterally, poor R wave progression.  Pt denies pre-syncope or LOC.  NO chest pain at all.    Drinks caffeine 2-3 cups per day.  Using marijuana that she obtained from [place] and smokes twice a day to help with her glaucoma.     She additionally still has tinnitus symptoms  She has seen Dr.   Ernesto Rutherford for this but would like to   See a different ENT  No Known Allergies Past Medical History  Diagnosis Date  . Borderline hypertension   . Anxiety   . Depression   . Hypertension   . Hyperlipidemia    Past Surgical History  Procedure Laterality Date  . Clavicle surgery      @ 19  . Nose surgery      Fractured from baseball injury   . Tubal ligation    . Colonoscopy  2006  . Colonoscopy  2001    Done for positive stool cards   . Tonsillectomy    . Cystoscopy  2007    Dr.MacDiarmid  . Breast surgery    . Facial cosmetic surgery      face lift  . Rhinoplasty    . Cesarean section     History   Social History  . Marital Status: Married    Spouse Name: N/A    Number of Children: N/A  . Years of Education: N/A   Occupational History  . Not on file.   Social History Main Topics  . Smoking status: Former Smoker    Quit date: 01/22/1988  . Smokeless tobacco: Not on file  . Alcohol Use: 0.0 oz/week    2-3 Glasses of wine per week     Comment: 2-3 daily  . Drug Use: Yes    Special: Marijuana  . Sexual Activity: Yes   Other Topics Concern  . Not on file   Social History Narrative  . No narrative on file   Family History  Problem Relation Age of Onset  . Melanoma Sister   . Cancer Sister       melanoma  . Stroke Mother 8  . Heart attack Father 67   Patient Active Problem List   Diagnosis Date Noted  . Alcohol dependence 07/28/2012  . Marijuana use 07/28/2012  . Elevated LFTs 05/21/2012  . Glaucoma 03/16/2012  . Menopause 03/16/2012  . Family history of melanoma 03/16/2012  . Family history of MI (myocardial infarction) 03/16/2012  . HYPERLIPIDEMIA 12/14/2008  . DEPRESSION 12/14/2008  . ROTATOR CUFF SYNDROME, LEFT 12/14/2008  . FASTING HYPERGLYCEMIA 12/14/2008  . DYSPEPSIA 07/03/2007  . ANXIETY STATE, UNSPECIFIED 06/04/2007  . OSTEOPENIA 06/04/2007   Current Outpatient Prescriptions on File Prior to Visit  Medication Sig Dispense Refill  . aspirin 81 MG tablet Take 81 mg by mouth 2 (two) times daily.      . Dietary Management Product (ENLYTE PO) Take 1 tablet by mouth daily.      Marland Kitchen escitalopram (LEXAPRO) 20 MG tablet Take 20 mg by mouth daily.        Marland Kitchen  Estradiol (EVAMIST TD) Place onto the skin.        . fish oil-omega-3 fatty acids 1000 MG capsule Take 2 g by mouth daily.      Marland Kitchen latanoprost (XALATAN) 0.005 % ophthalmic solution 1 drop at bedtime.      Marland Kitchen LORazepam (ATIVAN) 1 MG tablet Take 1 mg by mouth as directed. 1/2 by mouth once daily as needed       . Multiple Minerals TABS Take 1 tablet by mouth daily.      . niacin 100 MG tablet Take 100 mg by mouth daily with breakfast.      . omeprazole (PRILOSEC) 20 MG capsule Take 20 mg by mouth daily.        . progesterone (PROMETRIUM) 100 MG capsule Take 100 mg by mouth daily.        . traZODone (DESYREL) 50 MG tablet Take 50 mg by mouth as directed. Take 2 by mouth once daily       . Coenzyme Q10 (CO Q 10) 10 MG CAPS Take 1 tablet by mouth daily.      Marland Kitchen diltiazem (CARDIZEM CD) 360 MG 24 hr capsule Take 1 capsule (360 mg total) by mouth daily.  30 capsule  0   No current facility-administered medications on file prior to visit.       Review of Systems See HPI    Objective:   Physical Exam  Physical Exam   Nursing note and vitals reviewed.  Constitutional: She is oriented to person, place, and time. She appears well-developed and well-nourished.  HENT:  Head: Normocephalic and atraumatic.  Cardiovascular: Normal rate and regular rhythm. Exam reveals no gallop and no friction rub.  No murmur heard.  Pulmonary/Chest: Breath sounds normal. She has no wheezes. She has no rales.  Neurological: She is alert and oriented to person, place, and time.  Skin: Skin is warm and dry.  Psychiatric: She has a normal mood and affect. Her behavior is normal.         Assessment & Plan:  Palpitations  Will check TSH today  With labs.  Will refer for possible Holter.  May be anxiety related.   ?? If marijuana a contributor    HTN  Will stop cardizem and change to beta blocker.  Atenolol 25 mg daily  See me in 3-4 weeks and titrate upward if needed   Tinnitus will refer to new ENT  Elevated CA in ER  Will rehechekc today  See me in 3-4 weeks

## 2013-04-08 NOTE — Patient Instructions (Signed)
See me in 4 weeks  30 min  Stop cardizem  In am start atenolol one daily

## 2013-04-12 ENCOUNTER — Telehealth: Payer: Self-pay | Admitting: *Deleted

## 2013-04-12 LAB — PTH, INTACT AND CALCIUM
Calcium: 10.1 mg/dL (ref 8.4–10.5)
PTH: 15.9 pg/mL (ref 14.0–72.0)

## 2013-04-12 NOTE — Telephone Encounter (Signed)
Message copied by Conley Rolls on Mon Apr 12, 2013  4:07 PM ------      Message from: Emi Belfast D      Created: Mon Apr 12, 2013  8:14 AM       Jolayne Haines            Call [place] and let her know that her liver functions tests are even higher now.  Give her 30 min appt to see me in office.  Message back with date ------

## 2013-04-12 NOTE — Telephone Encounter (Signed)
LVM messgage for pt to return call.

## 2013-04-14 ENCOUNTER — Encounter: Payer: Self-pay | Admitting: Internal Medicine

## 2013-04-14 ENCOUNTER — Ambulatory Visit (INDEPENDENT_AMBULATORY_CARE_PROVIDER_SITE_OTHER): Payer: 59 | Admitting: Internal Medicine

## 2013-04-14 ENCOUNTER — Other Ambulatory Visit: Payer: Self-pay | Admitting: Internal Medicine

## 2013-04-14 VITALS — BP 129/88 | HR 91 | Temp 98.4°F | Resp 18 | Wt 158.0 lb

## 2013-04-14 DIAGNOSIS — R945 Abnormal results of liver function studies: Principal | ICD-10-CM

## 2013-04-14 DIAGNOSIS — F1020 Alcohol dependence, uncomplicated: Secondary | ICD-10-CM

## 2013-04-14 DIAGNOSIS — N6310 Unspecified lump in the right breast, unspecified quadrant: Secondary | ICD-10-CM

## 2013-04-14 DIAGNOSIS — N63 Unspecified lump in unspecified breast: Secondary | ICD-10-CM

## 2013-04-14 DIAGNOSIS — R7989 Other specified abnormal findings of blood chemistry: Secondary | ICD-10-CM

## 2013-04-14 DIAGNOSIS — Z20828 Contact with and (suspected) exposure to other viral communicable diseases: Secondary | ICD-10-CM

## 2013-04-14 MED ORDER — OSELTAMIVIR PHOSPHATE 75 MG PO CAPS: ORAL_CAPSULE | ORAL | Status: DC

## 2013-04-14 NOTE — Patient Instructions (Signed)
Needs diagnostic mammogram R breast at breast center  Needs complete abd ultrasound for increased lfts (set up at same place if you can)   Beckey Downing counselor on [address]  Pt to call to set up appointment

## 2013-04-14 NOTE — Progress Notes (Signed)
Subjective:    Patient ID: Molly Hubbard, female    DOB: 01-11-1951, 63 y.o.   MRN: [id number]  HPI  Molly Hubbard is here for acute visit.  Exposure to friend who is pos for influenza.  Lots of nasal congestion  No documented fever  No  Cough  Muscle aches  Two weeks ago felt breast mass R nipple .  NO discharge  No FH of breast cancer.  She has bilateral implants.  MM 6/14 neg  See lft's  Molly Hubbard has known alcohol dependence and will drink  5-6 glasses of mixed ETOH most days.    She has been drinking most days and she smokes marijuana for her glaucoma.  Has a friend get marijuana from  [place]. I have counseled her to try a 12 step meeting but pt declined to attend any AA     No Known Allergies Past Medical History  Diagnosis Date  . Borderline hypertension   . Anxiety   . Depression   . Hypertension   . Hyperlipidemia    Past Surgical History  Procedure Laterality Date  . Clavicle surgery      @ 19  . Nose surgery      Fractured from baseball injury   . Tubal ligation    . Colonoscopy  2006  . Colonoscopy  2001    Done for positive stool cards   . Tonsillectomy    . Cystoscopy  2007    Dr.MacDiarmid  . Breast surgery    . Facial cosmetic surgery      face lift  . Rhinoplasty    . Cesarean section     History   Social History  . Marital Status: Married    Spouse Name: N/A    Number of Children: N/A  . Years of Education: N/A   Occupational History  . Not on file.   Social History Main Topics  . Smoking status: Former Smoker    Quit date: 01/22/1988  . Smokeless tobacco: Not on file  . Alcohol Use: 0.0 oz/week    2-3 Glasses of wine per week     Comment: 2-3 daily  . Drug Use: Yes    Special: Marijuana  . Sexual Activity: Yes   Other Topics Concern  . Not on file   Social History Narrative  . No narrative on file   Family History  Problem Relation Age of Onset  . Melanoma Sister   . Cancer Sister     melanoma  . Stroke Mother 41  . Heart attack  Father 69   Patient Active Problem List   Diagnosis Date Noted  . Alcohol dependence 07/28/2012  . Marijuana use 07/28/2012  . Elevated LFTs 05/21/2012  . Glaucoma 03/16/2012  . Menopause 03/16/2012  . Family history of melanoma 03/16/2012  . Family history of MI (myocardial infarction) 03/16/2012  . HYPERLIPIDEMIA 12/14/2008  . DEPRESSION 12/14/2008  . ROTATOR CUFF SYNDROME, LEFT 12/14/2008  . FASTING HYPERGLYCEMIA 12/14/2008  . DYSPEPSIA 07/03/2007  . ANXIETY STATE, UNSPECIFIED 06/04/2007  . OSTEOPENIA 06/04/2007   Current Outpatient Prescriptions on File Prior to Visit  Medication Sig Dispense Refill  . aspirin 81 MG tablet Take 81 mg by mouth 2 (two) times daily.      Marland Kitchen atenolol (TENORMIN) 25 MG tablet Take 1 tablet (25 mg total) by mouth daily.  30 tablet  1  . Coenzyme Q10 (CO Q 10) 10 MG CAPS Take 1 tablet by mouth daily.      Marland Kitchen  Dietary Management Product (ENLYTE PO) Take 1 tablet by mouth daily.      Marland Kitchen diltiazem (CARDIZEM CD) 360 MG 24 hr capsule Take 1 capsule (360 mg total) by mouth daily.  30 capsule  0  . escitalopram (LEXAPRO) 20 MG tablet Take 20 mg by mouth daily.        . Estradiol (EVAMIST TD) Place onto the skin.        . fish oil-omega-3 fatty acids 1000 MG capsule Take 2 g by mouth daily.      Marland Kitchen latanoprost (XALATAN) 0.005 % ophthalmic solution 1 drop at bedtime.      Marland Kitchen LORazepam (ATIVAN) 1 MG tablet Take 1 mg by mouth as directed. 1/2 by mouth once daily as needed       . Multiple Minerals TABS Take 1 tablet by mouth daily.      . niacin 100 MG tablet Take 100 mg by mouth daily with breakfast.      . omeprazole (PRILOSEC) 20 MG capsule Take 20 mg by mouth daily.        . progesterone (PROMETRIUM) 100 MG capsule Take 100 mg by mouth daily.        . traZODone (DESYREL) 50 MG tablet Take 50 mg by mouth as directed. Take 2 by mouth once daily        No current facility-administered medications on file prior to visit.      Review of Systems See HPI      Objective:   Physical Exam  Physical Exam  Nursing note and vitals reviewed.  Constitutional: She is oriented to person, place, and time. She appears well-developed and well-nourished.  HENT:  Head: Normocephalic and atraumatic.  Chest she has 2 masses near nipple 6"o'clock right Breast.   Cardiovascular: Normal rate and regular rhythm. Exam reveals no gallop and no friction rub.  No murmur heard.  Abd no HSM BS+  Non tender  Neurological: She is alert and oriented to person, place, and time.  Skin: Skin is warm and dry.  Psychiatric: She has a normal mood and affect. Her behavior is normal.         Assessment & Plan:  Elevated  Lfts'  :  Likely ETOH culprit but will get liver ultrasound.  I gave pt number to Beckey Downing substance abuse counselor - she states she will call.   She is scared and states she will cut back on ETOH  Breast mass will set up diagnostic at the breast center    Influenza exposure  Will treat with 10 day [place]

## 2013-04-19 ENCOUNTER — Ambulatory Visit: Payer: 59 | Admitting: Internal Medicine

## 2013-04-20 ENCOUNTER — Ambulatory Visit (HOSPITAL_BASED_OUTPATIENT_CLINIC_OR_DEPARTMENT_OTHER): Payer: 59

## 2013-04-21 ENCOUNTER — Ambulatory Visit
Admission: RE | Admit: 2013-04-21 | Discharge: 2013-04-21 | Disposition: A | Discharge status: Home / Self Care | Payer: 59 | Source: Ambulatory Visit | Attending: Internal Medicine | Admitting: Internal Medicine

## 2013-04-21 DIAGNOSIS — N6310 Unspecified lump in the right breast, unspecified quadrant: Secondary | ICD-10-CM

## 2013-04-25 ENCOUNTER — Telehealth: Payer: Self-pay | Admitting: Internal Medicine

## 2013-04-25 NOTE — Telephone Encounter (Signed)
[employer]  Call [place] and let her know that I have seen her breast ultrasound result and report states area is calcification around the breast implant  Give her a 30 min appt with me at end of April.  Let her know that I want to re-examine the area and recheck her LFt"S    Encourage her to make appt with Deb YOung ( and alcohol counselor) if Adah has not already done so  Message back with appt date

## 2013-04-26 NOTE — Telephone Encounter (Signed)
Notified pt of [place] results and appt made for 04/28 pt has not made an appt with councelor encouraged pt to do so

## 2013-05-05 ENCOUNTER — Ambulatory Visit: Payer: 59 | Admitting: Internal Medicine

## 2013-05-18 ENCOUNTER — Encounter: Payer: Self-pay | Admitting: Internal Medicine

## 2013-05-18 ENCOUNTER — Ambulatory Visit (INDEPENDENT_AMBULATORY_CARE_PROVIDER_SITE_OTHER): Payer: 59 | Admitting: Internal Medicine

## 2013-05-18 VITALS — BP 130/82 | HR 67 | Temp 98.2°F | Resp 18 | Wt 155.0 lb

## 2013-05-18 DIAGNOSIS — R921 Mammographic calcification found on diagnostic imaging of breast: Secondary | ICD-10-CM | POA: Insufficient documentation

## 2013-05-18 DIAGNOSIS — R7989 Other specified abnormal findings of blood chemistry: Secondary | ICD-10-CM

## 2013-05-18 DIAGNOSIS — R928 Other abnormal and inconclusive findings on diagnostic imaging of breast: Secondary | ICD-10-CM

## 2013-05-18 DIAGNOSIS — R945 Abnormal results of liver function studies: Principal | ICD-10-CM

## 2013-05-18 DIAGNOSIS — F1020 Alcohol dependence, uncomplicated: Secondary | ICD-10-CM

## 2013-05-18 NOTE — Progress Notes (Signed)
Subjective:    Patient ID: Molly Hubbard, female    DOB: 10-Feb-1950, 63 y.o.   MRN: [id number]  HPI  [place] is here for follow up.  See Dx mm  Suggest calcified scar tissue in R breast  Area has not changed in size.     She tells me she stopped "drinking   Wild [place]" and is now down to 3 glasses of wine daily.  She did not make appt with SA counselor  " I think I can do this on my own"    No Known Allergies Past Medical History  Diagnosis Date  . Borderline hypertension   . Anxiety   . Depression   . Hypertension   . Hyperlipidemia    Past Surgical History  Procedure Laterality Date  . Clavicle surgery      @ 19  . Nose surgery      Fractured from baseball injury   . Tubal ligation    . Colonoscopy  2006  . Colonoscopy  2001    Done for positive stool cards   . Tonsillectomy    . Cystoscopy  2007    Dr.MacDiarmid  . Breast surgery    . Facial cosmetic surgery      face lift  . Rhinoplasty    . Cesarean section     History   Social History  . Marital Status: Married    Spouse Name: N/A    Number of Children: N/A  . Years of Education: N/A   Occupational History  . Not on file.   Social History Main Topics  . Smoking status: Former Smoker    Quit date: 01/22/1988  . Smokeless tobacco: Not on file  . Alcohol Use: 0.0 oz/week    2-3 Glasses of wine per week     Comment: 2-3 daily  . Drug Use: Yes    Special: Marijuana  . Sexual Activity: Yes   Other Topics Concern  . Not on file   Social History Narrative  . No narrative on file   Family History  Problem Relation Age of Onset  . Melanoma Sister   . Cancer Sister     melanoma  . Stroke Mother 36  . Heart attack Father 37   Patient Active Problem List   Diagnosis Date Noted  . Breast calcification, right  DX mm 2015 05/18/2013  . Alcohol dependence 07/28/2012  . Marijuana use 07/28/2012  . Elevated LFTs 05/21/2012  . Glaucoma 03/16/2012  . Menopause 03/16/2012  . Family history of  melanoma 03/16/2012  . Family history of MI (myocardial infarction) 03/16/2012  . HYPERLIPIDEMIA 12/14/2008  . DEPRESSION 12/14/2008  . ROTATOR CUFF SYNDROME, LEFT 12/14/2008  . FASTING HYPERGLYCEMIA 12/14/2008  . DYSPEPSIA 07/03/2007  . ANXIETY STATE, UNSPECIFIED 06/04/2007  . OSTEOPENIA 06/04/2007   Current Outpatient Prescriptions on File Prior to Visit  Medication Sig Dispense Refill  . aspirin 81 MG tablet Take 81 mg by mouth 2 (two) times daily.      Marland Kitchen atenolol (TENORMIN) 25 MG tablet Take 1 tablet (25 mg total) by mouth daily.  30 tablet  1  . Coenzyme Q10 (CO Q 10) 10 MG CAPS Take 1 tablet by mouth daily.      . Dietary Management Product (ENLYTE PO) Take 1 tablet by mouth daily.      Marland Kitchen diltiazem (CARDIZEM CD) 360 MG 24 hr capsule Take 1 capsule (360 mg total) by mouth daily.  30 capsule  0  .  escitalopram (LEXAPRO) 20 MG tablet Take 20 mg by mouth daily.        . Estradiol (EVAMIST TD) Place onto the skin.        . fish oil-omega-3 fatty acids 1000 MG capsule Take 2 g by mouth daily.      Marland Kitchen latanoprost (XALATAN) 0.005 % ophthalmic solution 1 drop at bedtime.      Marland Kitchen LORazepam (ATIVAN) 1 MG tablet Take 1 mg by mouth as directed. 1/2 by mouth once daily as needed       . Multiple Minerals TABS Take 1 tablet by mouth daily.      . niacin 100 MG tablet Take 100 mg by mouth daily with breakfast.      . omeprazole (PRILOSEC) 20 MG capsule Take 20 mg by mouth daily.        Marland Kitchen oseltamivir (TAMIFLU) 75 MG capsule Take one capsule for 10 days  10 capsule  0  . progesterone (PROMETRIUM) 100 MG capsule Take 100 mg by mouth daily.        . traZODone (DESYREL) 50 MG tablet Take 50 mg by mouth as directed. Take 2 by mouth once daily        No current facility-administered medications on file prior to visit.      Review of Systems See  HPI    Objective:   Physical Exam  Physical Exam  Nursing note and vitals reviewed.  Constitutional: She is oriented to person, place, and time. She  appears well-developed and well-nourished.  HENT:  Head: Normocephalic and atraumatic.  Cardiovascular: Normal rate and regular rhythm. Exam reveals no gallop and no friction rub.  No murmur heard.  Pulmonary/Chest: Breath sounds normal. She has no wheezes. She has no rales.   Breast  Mass near nipple persists  No change in size Neurological: She is alert and oriented to person, place, and time.  Skin: Skin is warm and dry.  Psychiatric: She has a normal mood and affect. Her behavior is normal.            Assessment & Plan:  Breast calcified nodule  Pt reminded of screening mm to be done this summer  Hepatitis  Likely ETOH related  Will repeat labs.  ADvised to have 2 glasses of wine daily at maximum.  Advised to reschedule her abd ultrasound  ETOH dependence  See above.  Again advised ETOH counselor would be of great benefti

## 2013-05-18 NOTE — Patient Instructions (Signed)
See me prn

## 2013-06-02 ENCOUNTER — Institutional Professional Consult (permissible substitution): Payer: 59 | Admitting: Cardiology

## 2013-06-14 ENCOUNTER — Telehealth: Payer: Self-pay | Admitting: Internal Medicine

## 2013-06-14 NOTE — Telephone Encounter (Signed)
Molly Hubbard  Call pt and ask if she has rescheduled her abd ultrasound that was ordered in the past.  She stated she would reschedule.  She also was supposed to have repeat liver function tests at her last visit with me.  Did she get this done?  No results in chart  Message back with her response

## 2013-06-15 ENCOUNTER — Other Ambulatory Visit: Payer: Self-pay | Admitting: *Deleted

## 2013-06-15 NOTE — Telephone Encounter (Signed)
LVM message Awaiting return call

## 2013-06-15 NOTE — Telephone Encounter (Signed)
Refill request

## 2013-06-16 ENCOUNTER — Other Ambulatory Visit: Payer: Self-pay | Admitting: *Deleted

## 2013-06-16 MED ORDER — ATENOLOL 25 MG PO TABS: 25.0000 mg | ORAL_TABLET | Freq: Every day | ORAL | Status: DC

## 2013-06-21 ENCOUNTER — Other Ambulatory Visit: Payer: Self-pay | Admitting: *Deleted

## 2013-06-28 ENCOUNTER — Other Ambulatory Visit: Payer: Self-pay | Admitting: Internal Medicine

## 2013-06-28 NOTE — Telephone Encounter (Signed)
Duplicate request

## 2013-07-01 ENCOUNTER — Other Ambulatory Visit: Payer: Self-pay | Admitting: *Deleted

## 2013-07-01 MED ORDER — ATENOLOL 25 MG PO TABS: 25.0000 mg | ORAL_TABLET | Freq: Every day | ORAL | Status: DC

## 2013-07-18 ENCOUNTER — Telehealth: Payer: Self-pay | Admitting: Internal Medicine

## 2013-07-18 NOTE — Telephone Encounter (Signed)
Molly Hubbard   Call pt and inquire if she went to have her liver blood tests repeated ( I ordered back in May ) and she was supposed to have an abd ultrasound at that time  Advise to reschedule U/S , give pt phone number and have her come in for a hepatic profile and acute hepatitis panel.    Message back with pts response    thanks

## 2013-07-19 NOTE — Telephone Encounter (Signed)
Called pt to follow up on labs that were ordered 05/18/13 and to set follow up appt - [facility] for pt to call back

## 2013-07-22 ENCOUNTER — Telehealth: Payer: Self-pay | Admitting: *Deleted

## 2013-07-22 NOTE — Telephone Encounter (Signed)
Called pt about labs that were not completed on 05/18/13 and she needs a follow up appt, Left message on cell

## 2013-09-23 ENCOUNTER — Other Ambulatory Visit: Payer: Self-pay | Admitting: Internal Medicine

## 2013-09-24 NOTE — Telephone Encounter (Signed)
I routed this to you for approval/denial. Pt has not had labs completed, nor has she returned my call to make a follow up appt after two phone call attempts.   Requested Medications     Medication name:  Name from pharmacy:  atenolol (TENORMIN) 25 MG tablet  ATENOLOL 25MG  TABLETS    Sig: TAKE 1 TABLET BY MOUTH EVERY DAY    Dispense: 90 tablet Refills: 0 Start: 09/23/2013  Class: Normal    Requested on: 09/23/2013    Originally ordered on: 04/08/2013 Last refill: 08/27/2013 Order History and Details

## 2013-11-22 ENCOUNTER — Encounter: Payer: Self-pay | Admitting: Internal Medicine

## 2013-11-28 ENCOUNTER — Encounter: Payer: Self-pay | Admitting: Internal Medicine

## 2013-12-08 ENCOUNTER — Telehealth: Payer: Self-pay

## 2013-12-08 NOTE — Telephone Encounter (Signed)
Mailed Certified Letter 76/1/84

## 2013-12-20 ENCOUNTER — Other Ambulatory Visit: Payer: Self-pay | Admitting: Internal Medicine

## 2013-12-20 NOTE — Telephone Encounter (Signed)
Refill request

## 2013-12-23 NOTE — Telephone Encounter (Signed)
I spoke with Molly Hubbard and made her an appointment for 01-05-14.-eh

## 2014-01-05 ENCOUNTER — Ambulatory Visit: Payer: 59 | Admitting: Internal Medicine

## 2014-01-05 ENCOUNTER — Telehealth: Payer: Self-pay

## 2014-01-05 MED ORDER — ATENOLOL 25 MG PO TABS: 25.0000 mg | ORAL_TABLET | Freq: Every day | ORAL | Status: DC

## 2014-01-05 NOTE — Telephone Encounter (Signed)
Molly Hubbard [phone] [name] called to reschedule her appointment to after the holidays and she needs a refill on her atenolol (TENORMIN) 25 MG tablet

## 2014-01-05 NOTE — Telephone Encounter (Signed)
Pt has appt scheduled 01/24/14, are you ok with sending refill of atenolol?

## 2014-01-24 ENCOUNTER — Encounter: Payer: Self-pay | Admitting: Internal Medicine

## 2014-01-24 ENCOUNTER — Ambulatory Visit (INDEPENDENT_AMBULATORY_CARE_PROVIDER_SITE_OTHER): Payer: 59 | Admitting: Internal Medicine

## 2014-01-24 VITALS — BP 123/77 | HR 68 | Resp 16 | Ht 66.0 in | Wt 163.0 lb

## 2014-01-24 DIAGNOSIS — R945 Abnormal results of liver function studies: Secondary | ICD-10-CM

## 2014-01-24 DIAGNOSIS — Z23 Encounter for immunization: Secondary | ICD-10-CM

## 2014-01-24 DIAGNOSIS — R7989 Other specified abnormal findings of blood chemistry: Secondary | ICD-10-CM

## 2014-01-24 DIAGNOSIS — I10 Essential (primary) hypertension: Secondary | ICD-10-CM

## 2014-01-24 LAB — HEPATIC FUNCTION PANEL
ALBUMIN: 4.3 g/dL (ref 3.5–5.2)
ALT: 20 U/L (ref 0–35)
AST: 26 U/L (ref 0–37)
Alkaline Phosphatase: 64 U/L (ref 39–117)
BILIRUBIN INDIRECT: 0.5 mg/dL (ref 0.2–1.2)
Bilirubin, Direct: 0.2 mg/dL (ref 0.0–0.3)
TOTAL PROTEIN: 6.6 g/dL (ref 6.0–8.3)
Total Bilirubin: 0.7 mg/dL (ref 0.2–1.2)

## 2014-01-24 NOTE — Patient Instructions (Signed)
Schedule CPE

## 2014-01-24 NOTE — Progress Notes (Signed)
   Subjective:    Patient ID: Molly Hubbard, female    DOB: 1950-04-24, 64 y.o.   MRN: [id number]  HPI   TODAY:  [place] is here for follow up.  Doing well  Out of town for holidays   Elevated lfts/  ETOH use  She did not go for her liver ultrasound as yet  Advised to go   HTN  Tolerating meds fine  She does see Dr. Toy Care for her psychiatric meds    04/2013  Breast calcified nodule Pt reminded of screening mm to be done this summer  Hepatitis Likely ETOH related Will repeat labs. ADvised to have 2 glasses of wine daily at maximum. Advised to reschedule her abd ultrasound  ETOH dependence See above. Again advised ETOH counselor would be of great benefti    Review of Systems See HPI    Objective:   Physical Exam Physical Exam  Nursing note and vitals reviewed.  Constitutional: She is oriented to person, place, and time. She appears well-developed and well-nourished.  HENT:  Head: Normocephalic and atraumatic.  Cardiovascular: Normal rate and regular rhythm. Exam reveals no gallop and no friction rub.  No murmur heard.  Pulmonary/Chest: Breath sounds normal. She has no wheezes. She has no rales.  Neurological: She is alert and oriented to person, place, and time.  Skin: Skin is warm and dry.  Psychiatric: She has a normal mood and affect. Her behavior is normal.        Assessment & Plan:  HTN  Continue atenolol  Elevated transaminases:    wil get repeat labs,  hepatiits panel,  ANA    Again advised to seek counseleing for ETOH USE   And the multiple potential damage from excessive ETOH use   Schedule CPE

## 2014-01-25 ENCOUNTER — Telehealth: Payer: Self-pay | Admitting: *Deleted

## 2014-01-25 LAB — HEPATITIS PANEL, ACUTE
HCV Ab: NEGATIVE
HEP B C IGM: NONREACTIVE
Hep A IgM: NONREACTIVE
Hepatitis B Surface Ag: NEGATIVE

## 2014-01-25 LAB — ANA: ANA: NEGATIVE

## 2014-01-25 NOTE — Telephone Encounter (Signed)
I spoke with Sula Soda and gave her the lab results. I also mailed a copy of the labs to the patient

## 2014-01-25 NOTE — Telephone Encounter (Signed)
-----   Message from Lanice Shirts, MD sent at 01/25/2014  1:59 PM EST ----- Call pt and let her know that her liver blood tests and hepatitis are all normal  Ok ot mail labs to her

## 2014-02-03 ENCOUNTER — Other Ambulatory Visit: Payer: Self-pay | Admitting: Internal Medicine

## 2014-02-03 NOTE — Telephone Encounter (Signed)
Refill request

## 2015-03-31 ENCOUNTER — Other Ambulatory Visit: Payer: Self-pay | Admitting: Internal Medicine

## 2015-03-31 ENCOUNTER — Other Ambulatory Visit: Payer: Self-pay

## 2015-03-31 DIAGNOSIS — Z1231 Encounter for screening mammogram for malignant neoplasm of breast: Secondary | ICD-10-CM

## 2015-05-01 ENCOUNTER — Ambulatory Visit: Payer: 59

## 2015-05-29 ENCOUNTER — Ambulatory Visit: Payer: 59

## 2015-06-12 ENCOUNTER — Ambulatory Visit
Admission: RE | Admit: 2015-06-12 | Discharge: 2015-06-12 | Disposition: A | Discharge status: Home / Self Care | Payer: BLUE CROSS/BLUE SHIELD | Source: Ambulatory Visit | Attending: Internal Medicine | Admitting: Internal Medicine

## 2015-06-12 DIAGNOSIS — Z1231 Encounter for screening mammogram for malignant neoplasm of breast: Secondary | ICD-10-CM

## 2016-02-26 ENCOUNTER — Observation Stay (HOSPITAL_BASED_OUTPATIENT_CLINIC_OR_DEPARTMENT_OTHER)
Admission: EM | Admit: 2016-02-26 | Discharge: 2016-02-27 | Disposition: A | Discharge status: Home / Self Care | Payer: BLUE CROSS/BLUE SHIELD | Attending: Internal Medicine | Admitting: Internal Medicine

## 2016-02-26 ENCOUNTER — Emergency Department (HOSPITAL_BASED_OUTPATIENT_CLINIC_OR_DEPARTMENT_OTHER): Payer: BLUE CROSS/BLUE SHIELD

## 2016-02-26 ENCOUNTER — Encounter (HOSPITAL_BASED_OUTPATIENT_CLINIC_OR_DEPARTMENT_OTHER): Payer: Self-pay

## 2016-02-26 DIAGNOSIS — H409 Unspecified glaucoma: Secondary | ICD-10-CM | POA: Diagnosis not present

## 2016-02-26 DIAGNOSIS — I6522 Occlusion and stenosis of left carotid artery: Secondary | ICD-10-CM | POA: Diagnosis not present

## 2016-02-26 DIAGNOSIS — Z808 Family history of malignant neoplasm of other organs or systems: Secondary | ICD-10-CM | POA: Insufficient documentation

## 2016-02-26 DIAGNOSIS — G43109 Migraine with aura, not intractable, without status migrainosus: Secondary | ICD-10-CM | POA: Diagnosis not present

## 2016-02-26 DIAGNOSIS — R51 Headache: Secondary | ICD-10-CM

## 2016-02-26 DIAGNOSIS — Z87891 Personal history of nicotine dependence: Secondary | ICD-10-CM | POA: Diagnosis not present

## 2016-02-26 DIAGNOSIS — Z823 Family history of stroke: Secondary | ICD-10-CM | POA: Insufficient documentation

## 2016-02-26 DIAGNOSIS — R4702 Dysphasia: Secondary | ICD-10-CM | POA: Insufficient documentation

## 2016-02-26 DIAGNOSIS — F411 Generalized anxiety disorder: Secondary | ICD-10-CM | POA: Diagnosis not present

## 2016-02-26 DIAGNOSIS — R479 Unspecified speech disturbances: Secondary | ICD-10-CM

## 2016-02-26 DIAGNOSIS — Z7982 Long term (current) use of aspirin: Secondary | ICD-10-CM | POA: Insufficient documentation

## 2016-02-26 DIAGNOSIS — M75102 Unspecified rotator cuff tear or rupture of left shoulder, not specified as traumatic: Secondary | ICD-10-CM | POA: Insufficient documentation

## 2016-02-26 DIAGNOSIS — R519 Headache, unspecified: Secondary | ICD-10-CM

## 2016-02-26 DIAGNOSIS — Z79899 Other long term (current) drug therapy: Secondary | ICD-10-CM | POA: Insufficient documentation

## 2016-02-26 DIAGNOSIS — F1020 Alcohol dependence, uncomplicated: Secondary | ICD-10-CM | POA: Diagnosis not present

## 2016-02-26 DIAGNOSIS — I700 Atherosclerosis of aorta: Secondary | ICD-10-CM | POA: Diagnosis not present

## 2016-02-26 DIAGNOSIS — Z8249 Family history of ischemic heart disease and other diseases of the circulatory system: Secondary | ICD-10-CM | POA: Diagnosis not present

## 2016-02-26 DIAGNOSIS — I10 Essential (primary) hypertension: Secondary | ICD-10-CM | POA: Diagnosis not present

## 2016-02-26 DIAGNOSIS — R4781 Slurred speech: Secondary | ICD-10-CM | POA: Diagnosis not present

## 2016-02-26 DIAGNOSIS — E785 Hyperlipidemia, unspecified: Secondary | ICD-10-CM | POA: Diagnosis not present

## 2016-02-26 DIAGNOSIS — E871 Hypo-osmolality and hyponatremia: Secondary | ICD-10-CM | POA: Diagnosis not present

## 2016-02-26 DIAGNOSIS — G459 Transient cerebral ischemic attack, unspecified: Secondary | ICD-10-CM | POA: Diagnosis not present

## 2016-02-26 DIAGNOSIS — F329 Major depressive disorder, single episode, unspecified: Secondary | ICD-10-CM | POA: Insufficient documentation

## 2016-02-26 DIAGNOSIS — F1290 Cannabis use, unspecified, uncomplicated: Secondary | ICD-10-CM | POA: Diagnosis not present

## 2016-02-26 DIAGNOSIS — M8580 Other specified disorders of bone density and structure, unspecified site: Secondary | ICD-10-CM | POA: Insufficient documentation

## 2016-02-26 DIAGNOSIS — G43909 Migraine, unspecified, not intractable, without status migrainosus: Secondary | ICD-10-CM

## 2016-02-26 DIAGNOSIS — R2689 Other abnormalities of gait and mobility: Secondary | ICD-10-CM

## 2016-02-26 LAB — COMPREHENSIVE METABOLIC PANEL
ALT: 45 U/L (ref 14–54)
AST: 44 U/L — AB (ref 15–41)
Albumin: 4.7 g/dL (ref 3.5–5.0)
Alkaline Phosphatase: 56 U/L (ref 38–126)
Anion gap: 13 (ref 5–15)
BILIRUBIN TOTAL: 1 mg/dL (ref 0.3–1.2)
BUN: 9 mg/dL (ref 6–20)
CALCIUM: 9.9 mg/dL (ref 8.9–10.3)
CO2: 26 mmol/L (ref 22–32)
Chloride: 85 mmol/L — ABNORMAL LOW (ref 101–111)
Creatinine, Ser: 0.73 mg/dL (ref 0.44–1.00)
GFR calc Af Amer: >60 mL/min (ref >60)
GFR calc non Af Amer: >60 mL/min (ref >60)
Glucose, Bld: 107 mg/dL — ABNORMAL HIGH (ref 65–99)
POTASSIUM: 3.5 mmol/L (ref 3.5–5.1)
Sodium: 124 mmol/L — ABNORMAL LOW (ref 135–145)
TOTAL PROTEIN: 7.3 g/dL (ref 6.5–8.1)

## 2016-02-26 LAB — CBC
HCT: 35.3 % — ABNORMAL LOW (ref 36.0–46.0)
Hemoglobin: 12.4 g/dL (ref 12.0–15.0)
MCH: 32.9 pg (ref 26.0–34.0)
MCHC: 35.1 g/dL (ref 30.0–36.0)
MCV: 93.6 fL (ref 78.0–100.0)
PLATELETS: 206 10*3/uL (ref 150–400)
RBC: 3.77 MIL/uL — AB (ref 3.87–5.11)
RDW: 13.3 % (ref 11.5–15.5)
WBC: 5.7 10*3/uL (ref 4.0–10.5)

## 2016-02-26 LAB — URINALYSIS, ROUTINE W REFLEX MICROSCOPIC
BILIRUBIN URINE: NEGATIVE
Glucose, UA: NEGATIVE mg/dL
Hgb urine dipstick: NEGATIVE
Ketones, ur: 15 mg/dL — AB
LEUKOCYTES UA: NEGATIVE
NITRITE: NEGATIVE
PH: 6 (ref 5.0–8.0)
Protein, ur: NEGATIVE mg/dL
SPECIFIC GRAVITY, URINE: 1.005 (ref 1.005–1.030)

## 2016-02-26 LAB — CBC WITH DIFFERENTIAL/PLATELET
BASOS ABS: 0 10*3/uL (ref 0.0–0.1)
BASOS PCT: 0 %
EOS ABS: 0.1 10*3/uL (ref 0.0–0.7)
EOS PCT: 1 %
HCT: 36.5 % (ref 36.0–46.0)
Hemoglobin: 13.2 g/dL (ref 12.0–15.0)
Lymphocytes Relative: 16 %
Lymphs Abs: 0.9 10*3/uL (ref 0.7–4.0)
MCH: 33.6 pg (ref 26.0–34.0)
MCHC: 36.2 g/dL — ABNORMAL HIGH (ref 30.0–36.0)
MCV: 92.9 fL (ref 78.0–100.0)
Monocytes Absolute: 0.7 10*3/uL (ref 0.1–1.0)
Monocytes Relative: 12 %
Neutro Abs: 3.9 10*3/uL (ref 1.7–7.7)
Neutrophils Relative %: 71 %
PLATELETS: 239 10*3/uL (ref 150–400)
RBC: 3.93 MIL/uL (ref 3.87–5.11)
RDW: 12.5 % (ref 11.5–15.5)
WBC: 5.5 10*3/uL (ref 4.0–10.5)

## 2016-02-26 LAB — C-REACTIVE PROTEIN: CRP: 1 mg/dL — ABNORMAL HIGH (ref <1.0)

## 2016-02-26 LAB — RAPID URINE DRUG SCREEN, HOSP PERFORMED
Amphetamines: NOT DETECTED
Barbiturates: NOT DETECTED
Benzodiazepines: NOT DETECTED
COCAINE: NOT DETECTED
OPIATES: NOT DETECTED
Tetrahydrocannabinol: NOT DETECTED

## 2016-02-26 LAB — SODIUM, URINE, RANDOM

## 2016-02-26 LAB — SEDIMENTATION RATE: SED RATE: 4 mm/h (ref 0–22)

## 2016-02-26 LAB — OSMOLALITY, URINE: OSMOLALITY UR: 116 mosm/kg — AB (ref 300–900)

## 2016-02-26 MED ORDER — SODIUM CHLORIDE 0.9 % IV BOLUS (SEPSIS)
1000.0000 mL | Freq: Once | INTRAVENOUS | Status: AC
  Administered 2016-02-26: 1000 mL via INTRAVENOUS

## 2016-02-26 MED ORDER — ASPIRIN 325 MG PO TABS
325.0000 mg | ORAL_TABLET | Freq: Every day | ORAL | Status: DC
  Administered 2016-02-27: 325 mg via ORAL
  Filled 2016-02-26: qty 1

## 2016-02-26 MED ORDER — ASPIRIN 300 MG RE SUPP: 300.0000 mg | Freq: Every day | RECTAL | Status: DC

## 2016-02-26 MED ORDER — ACETAMINOPHEN 160 MG/5ML PO SOLN: 650.0000 mg | ORAL | Status: DC | PRN

## 2016-02-26 MED ORDER — SODIUM CHLORIDE 0.9 % IV SOLN
INTRAVENOUS | Status: DC
  Administered 2016-02-26: 23:00:00 via INTRAVENOUS

## 2016-02-26 MED ORDER — PANTOPRAZOLE SODIUM 40 MG PO TBEC
40.0000 mg | DELAYED_RELEASE_TABLET | Freq: Every day | ORAL | Status: DC
  Administered 2016-02-27: 40 mg via ORAL
  Filled 2016-02-26: qty 1

## 2016-02-26 MED ORDER — NIACIN 100 MG PO TABS
100.0000 mg | ORAL_TABLET | Freq: Every day | ORAL | Status: DC
  Administered 2016-02-27: 100 mg via ORAL
  Filled 2016-02-26: qty 1

## 2016-02-26 MED ORDER — IOPAMIDOL (ISOVUE-370) INJECTION 76%
100.0000 mL | Freq: Once | INTRAVENOUS | Status: AC | PRN
  Administered 2016-02-26: 100 mL via INTRAVENOUS

## 2016-02-26 MED ORDER — ATENOLOL 25 MG PO TABS
25.0000 mg | ORAL_TABLET | Freq: Every day | ORAL | Status: DC
Start: 2016-02-27 — End: 2016-02-27
  Administered 2016-02-27: 25 mg via ORAL
  Filled 2016-02-26: qty 1

## 2016-02-26 MED ORDER — ACETAMINOPHEN 325 MG PO TABS
650.0000 mg | ORAL_TABLET | ORAL | Status: DC | PRN
  Filled 2016-02-26: qty 2

## 2016-02-26 MED ORDER — ESCITALOPRAM OXALATE 10 MG PO TABS
20.0000 mg | ORAL_TABLET | Freq: Every day | ORAL | Status: DC
  Administered 2016-02-27: 20 mg via ORAL
  Filled 2016-02-26: qty 2

## 2016-02-26 MED ORDER — LATANOPROST 0.005 % OP SOLN
1.0000 [drp] | Freq: Every day | OPHTHALMIC | Status: DC
  Administered 2016-02-26: 1 [drp] via OPHTHALMIC
  Filled 2016-02-26: qty 2.5

## 2016-02-26 MED ORDER — ENOXAPARIN SODIUM 40 MG/0.4ML ~~LOC~~ SOLN
40.0000 mg | Freq: Every day | SUBCUTANEOUS | Status: DC
  Filled 2016-02-26: qty 0.4

## 2016-02-26 MED ORDER — LORAZEPAM 1 MG PO TABS: 1.0000 mg | ORAL_TABLET | Freq: Every day | ORAL | Status: DC | PRN

## 2016-02-26 MED ORDER — TRAZODONE HCL 100 MG PO TABS
100.0000 mg | ORAL_TABLET | Freq: Every day | ORAL | Status: DC
  Administered 2016-02-26: 100 mg via ORAL
  Filled 2016-02-26: qty 1

## 2016-02-26 MED ORDER — STROKE: EARLY STAGES OF RECOVERY BOOK
Freq: Once | Status: AC
  Administered 2016-02-26: 23:00:00

## 2016-02-26 MED ORDER — ACETAMINOPHEN 650 MG RE SUPP: 650.0000 mg | RECTAL | Status: DC | PRN

## 2016-02-26 NOTE — Progress Notes (Signed)
[facility] Admission notified of patient arrival

## 2016-02-26 NOTE — ED Provider Notes (Signed)
[facility] DEPT Provider Note   CSN: [id number] Arrival date & time: 02/26/16 1126     History    Chief Complaint  Patient presents with  . Headache     HPI Molly Hubbard is a 66 y.o. female.  66yo F w/ PMH including HTN, HLD, anx/dep who p/w Headache and speech problems. Patient states that 3 nights ago she was asleep and in the middle of the night was awakened by a headache. She reports that the headache was severe and associated with blurry vision. She took some medicine and went back to bed. That morning she woke back up and continued to have severe headache. Husband noted that she was having some speech problems with some slurred speech and confusion/disorientation. She took Tylenol and later in the afternoon took hydrocodone, which later ease of her headache. She reports a very mild headache currently but states that it has not been intense for 2 days. Her blurry vision has improved but is still not 100% normal. She denies any extremity weakness/numbness, neck pain, fevers, vomiting, cough/cold symptoms, chest pain, or shortness of breath. She has never had these symptoms before. No history of head injury recently.   Past Medical History:  Diagnosis Date  . Anxiety   . Borderline hypertension   . Depression   . Hyperlipidemia   . Hypertension      Patient Active Problem List   Diagnosis Date Noted  . Slurred speech 02/26/2016  . HTN (hypertension) 01/24/2014  . Breast calcification, right  DX mm 2015 05/18/2013  . Alcohol dependence ([place]) 07/28/2012  . Marijuana use 07/28/2012  . Elevated LFTs 05/21/2012  . Glaucoma 03/16/2012  . Menopause 03/16/2012  . Family history of melanoma 03/16/2012  . Family history of MI (myocardial infarction) 03/16/2012  . HYPERLIPIDEMIA 12/14/2008  . DEPRESSION 12/14/2008  . ROTATOR CUFF SYNDROME, LEFT 12/14/2008  . FASTING HYPERGLYCEMIA 12/14/2008  . DYSPEPSIA 07/03/2007  . ANXIETY STATE, UNSPECIFIED 06/04/2007  . OSTEOPENIA  06/04/2007    Past Surgical History:  Procedure Laterality Date  . BREAST SURGERY    . CESAREAN SECTION    . CLAVICLE SURGERY     @ 19  . COLONOSCOPY  2006  . COLONOSCOPY  2001   Done for positive stool cards   . CYSTOSCOPY  2007   Dr.MacDiarmid  . FACIAL COSMETIC SURGERY     face lift  . NOSE SURGERY     Fractured from baseball injury   . RHINOPLASTY    . TONSILLECTOMY    . TUBAL LIGATION      OB History    Gravida Para Term Preterm AB Living   3       1 2    SAB TAB Ectopic Multiple Live Births   1                Home Medications    Prior to Admission medications   Medication Sig Start Date End Date Taking? Authorizing Provider  aspirin 81 MG tablet Take 81 mg by mouth 2 (two) times daily.    Historical Provider, MD  atenolol (TENORMIN) 25 MG tablet TAKE 1 TABLET BY MOUTH DAILY 02/06/14   Lanice Shirts, MD  escitalopram (LEXAPRO) 20 MG tablet Take 20 mg by mouth daily.      Historical Provider, MD  fish oil-omega-3 fatty acids 1000 MG capsule Take 2 g by mouth daily.    Historical Provider, MD  latanoprost (XALATAN) 0.005 % ophthalmic solution 1 drop at bedtime.  Historical Provider, MD  LORazepam (ATIVAN) 1 MG tablet Take 1 mg by mouth as directed. 1/2 by mouth once daily as needed     Historical Provider, MD  Multiple Minerals TABS Take 1 tablet by mouth daily.    Historical Provider, MD  niacin 100 MG tablet Take 100 mg by mouth daily with breakfast.    Historical Provider, MD  omeprazole (PRILOSEC) 20 MG capsule Take 20 mg by mouth daily.      Historical Provider, MD  progesterone (PROMETRIUM) 100 MG capsule Take 100 mg by mouth daily.      Historical Provider, MD  traZODone (DESYREL) 50 MG tablet Take 50 mg by mouth as directed. Take 2 by mouth once daily     Historical Provider, MD      Family History  Problem Relation Age of Onset  . Melanoma Sister   . Cancer Sister     melanoma  . Stroke Mother 48  . Heart attack Father 90     Social  History  Substance Use Topics  . Smoking status: Former Smoker    Quit date: 01/22/1988  . Smokeless tobacco: Never Used  . Alcohol use Yes     Comment: daily     Allergies     Patient has no known allergies.    Review of Systems  10 Systems reviewed and are negative for acute change except as noted in the HPI.   Physical Exam Updated Vital Signs BP 125/77   Pulse 73   Temp 97.7 F (36.5 C) (Oral)   Resp 17   Ht 5\' 6"  (1.676 m)   Wt 153 lb (69.4 kg)   SpO2 96%   BMI 24.69 kg/m   Physical Exam  Constitutional: She is oriented to person, place, and time. She appears well-developed and well-nourished. No distress.  Awake, alert  HENT:  Head: Normocephalic and atraumatic.  Eyes: Conjunctivae and EOM are normal. Pupils are equal, round, and reactive to light.  Neck: Neck supple.  Cardiovascular: Normal rate, regular rhythm and normal heart sounds.   No murmur heard. Pulmonary/Chest: Effort normal and breath sounds normal. No respiratory distress.  Abdominal: Soft. Bowel sounds are normal. She exhibits no distension. There is no tenderness.  Musculoskeletal: She exhibits no edema.  Neurological: She is alert and oriented to person, place, and time. She has normal reflexes. She exhibits normal muscle tone.  Mild blurry vision L eye lateral visual field, other visual fields intact b/l; the remainder of CN exam normal Fluent speech, normal finger-to-nose testing, negative pronator drift, no clonus, normal rapid alternating movements 5/5 strength and normal sensation x all 4 extremities  Skin: Skin is warm and dry.  Psychiatric: She has a normal mood and affect. Judgment and thought content normal.  Nursing note and vitals reviewed.     ED Treatments / Results  Labs (all labs ordered are listed, but only abnormal results are displayed) Labs Reviewed  COMPREHENSIVE METABOLIC PANEL - Abnormal; Notable for the following:       Result Value   Sodium 124 (*)     Chloride 85 (*)    Glucose, Bld 107 (*)    AST 44 (*)    All other components within normal limits  CBC WITH DIFFERENTIAL/PLATELET - Abnormal; Notable for the following:    MCHC 36.2 (*)    All other components within normal limits  URINALYSIS, ROUTINE W REFLEX MICROSCOPIC - Abnormal; Notable for the following:    Ketones, ur 15 (*)  All other components within normal limits     EKG  EKG Interpretation  Date/Time:  Monday February 26 2016 12:46:01 EST Ventricular Rate:  84 PR Interval:    QRS Duration: 90 QT Interval:  373 QTC Calculation: 441 R Axis:   60 Text Interpretation:  Sinus rhythm Right atrial enlargement Probable anteroseptal infarct, old No significant change since last tracing Confirmed by Moishe Schellenberg MD, Letesha Klecker [phone]) on 02/26/2016 12:48:42 PM         Radiology Ct Angio Head W/cm &/or Wo Cm  Result Date: 02/26/2016 CLINICAL DATA:  Severe headache and speech disturbance over the last 3 days. EXAM: CT ANGIOGRAPHY HEAD AND NECK TECHNIQUE: Multidetector CT imaging of the head and neck was performed using the standard protocol during bolus administration of intravenous contrast. Multiplanar CT image reconstructions and MIPs were obtained to evaluate the vascular anatomy. Carotid stenosis measurements (when applicable) are obtained utilizing NASCET criteria, using the distal internal carotid diameter as the denominator. CONTRAST:  100 cc Isovue 370 COMPARISON:  Head CT earlier same day FINDINGS: CTA NECK FINDINGS Aortic arch: Atherosclerosis of the aortic arch. Branching pattern is normal, with the exception of a small left vertebral artery arising directly from the arch. Right carotid system: Common carotid artery widely patent to the bifurcation. Atherosclerotic calcification at the carotid bifurcation but without stenosis. Cervical ICA is tortuous but widely patent. Left carotid system: Common carotid artery widely patent to the bifurcation. Atherosclerotic disease at the  bifurcation and proximal ICA. 40% stenosis of the proximal ICA. Soft plaque without visible ulceration. Beyond the bulb, the ICA is tortuous but widely patent. Vertebral arteries: Right vertebral artery is dominant. Right vertebral artery origin is widely patent. The vessel is widely patent through the cervical region. As noted above, small left vertebral artery takes origin from the arch and is patent through the cervical region. Skeleton: Ordinary cervical spondylosis. Other neck: No significant finding. Upper chest: Normal Review of the MIP images confirms the above findings CTA HEAD FINDINGS Anterior circulation: Both internal carotid arteries are patent through the skullbase and siphon regions. No stenosis. Anterior and middle cerebral vessels are patent without proximal stenosis, aneurysm or vascular malformation. Posterior circulation: Left vertebral artery terminates in PICA. Right vertebral artery supplies basilar. No basilar stenosis. Posterior circulation branch vessels are normal. Venous sinuses: Patent and normal Anatomic variants: None significant Delayed phase: No abnormal enhancement Review of the MIP images confirms the above findings IMPRESSION: Atherosclerotic disease at both carotid bifurcations. No stenosis on the right. 40% stenosis of the proximal ICA on the left, with soft plaque that could be at greater risk of rupture. No intracranial anterior circulation disease. No posterior circulation disease. Left vertebral artery is a tiny vessel that originates from the arch. No lesions seen to explain headache or speech disturbance. Electronically Signed   By: Nelson Chimes M.D.   On: 02/26/2016 14:05   Ct Head Wo Contrast  Result Date: 02/26/2016 CLINICAL DATA:  Severe parietal headache and pressure since 02/24/2016, residual pain, visual disturbance, hallucinations, slurred speech, unable to make complete sentences, history hypertension EXAM: CT HEAD WITHOUT CONTRAST TECHNIQUE: Contiguous axial  images were obtained from the base of the skull through the vertex without intravenous contrast. Sagittal and coronal MPR images reconstructed from axial data set. COMPARISON:  None FINDINGS: Brain: Generalized atrophy. Normal ventricular morphology. No midline shift or mass effect. Mild small vessel chronic ischemic changes of deep cerebral white matter. No intracranial hemorrhage, mass lesion, or evidence acute infarction. No extra-axial  fluid collections. Vascular: Normal appearance Skull: Intact Sinuses/Orbits: Cleared Other: N/A IMPRESSION: Atrophy with mild small vessel chronic ischemic changes of deep cerebral white matter. No acute intracranial abnormalities. Electronically Signed   By: Lavonia Dana M.D.   On: 02/26/2016 12:04   Ct Angio Neck W Or Wo Contrast  Result Date: 02/26/2016 CLINICAL DATA:  Severe headache and speech disturbance over the last 3 days. EXAM: CT ANGIOGRAPHY HEAD AND NECK TECHNIQUE: Multidetector CT imaging of the head and neck was performed using the standard protocol during bolus administration of intravenous contrast. Multiplanar CT image reconstructions and MIPs were obtained to evaluate the vascular anatomy. Carotid stenosis measurements (when applicable) are obtained utilizing NASCET criteria, using the distal internal carotid diameter as the denominator. CONTRAST:  100 cc Isovue 370 COMPARISON:  Head CT earlier same day FINDINGS: CTA NECK FINDINGS Aortic arch: Atherosclerosis of the aortic arch. Branching pattern is normal, with the exception of a small left vertebral artery arising directly from the arch. Right carotid system: Common carotid artery widely patent to the bifurcation. Atherosclerotic calcification at the carotid bifurcation but without stenosis. Cervical ICA is tortuous but widely patent. Left carotid system: Common carotid artery widely patent to the bifurcation. Atherosclerotic disease at the bifurcation and proximal ICA. 40% stenosis of the proximal ICA. Soft  plaque without visible ulceration. Beyond the bulb, the ICA is tortuous but widely patent. Vertebral arteries: Right vertebral artery is dominant. Right vertebral artery origin is widely patent. The vessel is widely patent through the cervical region. As noted above, small left vertebral artery takes origin from the arch and is patent through the cervical region. Skeleton: Ordinary cervical spondylosis. Other neck: No significant finding. Upper chest: Normal Review of the MIP images confirms the above findings CTA HEAD FINDINGS Anterior circulation: Both internal carotid arteries are patent through the skullbase and siphon regions. No stenosis. Anterior and middle cerebral vessels are patent without proximal stenosis, aneurysm or vascular malformation. Posterior circulation: Left vertebral artery terminates in PICA. Right vertebral artery supplies basilar. No basilar stenosis. Posterior circulation branch vessels are normal. Venous sinuses: Patent and normal Anatomic variants: None significant Delayed phase: No abnormal enhancement Review of the MIP images confirms the above findings IMPRESSION: Atherosclerotic disease at both carotid bifurcations. No stenosis on the right. 40% stenosis of the proximal ICA on the left, with soft plaque that could be at greater risk of rupture. No intracranial anterior circulation disease. No posterior circulation disease. Left vertebral artery is a tiny vessel that originates from the arch. No lesions seen to explain headache or speech disturbance. Electronically Signed   By: Nelson Chimes M.D.   On: 02/26/2016 14:05    Procedures Procedures (including critical care time) Procedures  Medications Ordered in ED  Medications  sodium chloride 0.9 % bolus 1,000 mL (0 mLs Intravenous Stopped 02/26/16 1431)  iopamidol (ISOVUE-370) 76 % injection 100 mL (100 mLs Intravenous Contrast Given 02/26/16 1341)     Initial Impression / Assessment and Plan / ED Course  I have reviewed the  triage vital signs and the nursing notes.  Pertinent labs & imaging results that were available during my care of the patient were reviewed by me and considered in my medical decision making (see chart for details).     PT w/ severe headache 3 days ago associated w/ visual changes and speech problems. Her symptoms have improved over the past 2 days and are almost resolved currently. She came here after speaking with primary care provider. She was  awake and alert, comfortable on exam. Vital signs notable for hypertension. She had a normal neurologic exam with the exception of mild blurry vision in the left eye lateral visual field. No vision loss. Obtained CT of head which showed no acute findings. Lab work was notable for sodium 124, chloride 85, normal CBC. Gave the patient a liter of fluid. She does report decreased appetite recently which may explain her hyponatremia but I feel that it is unlikely to explain her speech problems. I obtained a CTA of head and neck to evaluate for vascular pathology such as aneurysm.  CTA shows atherosclerotic disease of both carotids with 1 plaque that is at risk for rupture. I discussed symptoms and imaging w/ neurologist at [facility], Dr. Alver Fisher, who felt that sx may represent TIA vs stroke and given concerning plaque/atherosclerosis he recommended MRI and stroke workup. I discussed with hospitalist, Dr. Alfredia Ferguson, who has accepted transfer. I appreciate his assistance with the patient's care. Pt transferred to [facility] for further work up.   Final Clinical Impressions(s) / ED Diagnoses   Final diagnoses:  Headache  Bad headache  Speech disturbance, unspecified type     New Prescriptions   No medications on file       Sharlett Iles, MD 02/26/16 1718

## 2016-02-26 NOTE — ED Triage Notes (Signed)
C/o HA and slurred speech 3 days ago-was better 2 days-contacted PCP today and was advised to come to ED-presents to triage in w/c-NAD-husband with pt

## 2016-02-26 NOTE — ED Notes (Signed)
Report to [place] on 23M- [place].

## 2016-02-26 NOTE — ED Notes (Signed)
Attempted to call report, nurse unavailable.

## 2016-02-26 NOTE — Progress Notes (Signed)
Patient arrived from [facility]. Denies pain, patient reports that she went to the ED because she had a "terrible headache, so bad  that she almost killed herself" Asked if she still thinks she might kill herself she states "No, not anymore". States that her husband gave her one of his codeine pain medicine that she feels better. Son arrived to the room and sitting at bedside. Will continue to monitor.

## 2016-02-26 NOTE — H&P (Signed)
History and Physical    NEPHTALIE JAROS [id number] DOB: September 12, 1950 DOA: 02/26/2016  PCP: Kelton Pillar, MD  Patient coming from: Home.  Chief Complaint: Headache and blurred vision with slurred speech.  HPI: ANASTASIA CUCCIO is a 66 y.o. female with history of hypertension presented to the ER with complaints of headache and blurred vision and slurred speech. Patient states her symptoms happened 72 hours ago when she was asleep and woke up with headache which was frontal with blurred vision and difficulty speaking. Patient was unable to bring out words clearly. Denies any weakness of the extremities. Symptoms lasted almost for 24 hours. Headache improved by patient taking husband's hydrocodone. Patient's slurred speech and blurry vision gradually improved. Yesterday patient read in the internet about the warning signs of stroke and patient called PCP and was advised to come to the ER.   ED Course: CT head followed by CT angiogram of the head and neck was done which did not show anything acute except for 40% stenosis in the proximal left ICA. On exam at the bedside patient was nonfocal.  Review of Systems: As per HPI, rest all negative.   Past Medical History:  Diagnosis Date  . Anxiety   . Borderline hypertension   . Depression   . Hyperlipidemia   . Hypertension     Past Surgical History:  Procedure Laterality Date  . BREAST SURGERY    . CESAREAN SECTION    . CLAVICLE SURGERY     @ 19  . COLONOSCOPY  2006  . COLONOSCOPY  2001   Done for positive stool cards   . CYSTOSCOPY  2007   Dr.MacDiarmid  . FACIAL COSMETIC SURGERY     face lift  . NOSE SURGERY     Fractured from baseball injury   . RHINOPLASTY    . TONSILLECTOMY    . TUBAL LIGATION       reports that she quit smoking about 28 years ago. She has never used smokeless tobacco. She reports that she drinks alcohol. She reports that she uses drugs, including Marijuana.  No Known Allergies  Family History    Problem Relation Age of Onset  . Melanoma Sister   . Cancer Sister     melanoma  . Stroke Mother 73  . Heart attack Father 76    Prior to Admission medications   Medication Sig Start Date End Date Taking? Authorizing Provider  acetaminophen (TYLENOL) 500 MG tablet Take 1,000 mg by mouth every 6 (six) hours as needed for headache.   Yes Historical Provider, MD  aspirin 81 MG tablet Take 81 mg by mouth daily.    Yes Historical Provider, MD  atenolol (TENORMIN) 25 MG tablet TAKE 1 TABLET BY MOUTH DAILY 02/06/14  Yes Lanice Shirts, MD  escitalopram (LEXAPRO) 20 MG tablet Take 20 mg by mouth daily.     Yes Historical Provider, MD  latanoprost (XALATAN) 0.005 % ophthalmic solution 1 drop at bedtime.   Yes Historical Provider, MD  LORazepam (ATIVAN) 1 MG tablet Take 1 mg by mouth daily as needed for anxiety.    Yes Historical Provider, MD  niacin 100 MG tablet Take 100 mg by mouth daily with breakfast.   Yes Historical Provider, MD  omeprazole (PRILOSEC) 20 MG capsule Take 20 mg by mouth daily.     Yes Historical Provider, MD  progesterone (PROMETRIUM) 100 MG capsule Take 100 mg by mouth daily.     Yes Historical Provider, MD  traZODone (DESYREL)  50 MG tablet Take 100 mg by mouth as directed.    Yes Historical Provider, MD  fish oil-omega-3 fatty acids 1000 MG capsule Take 2 g by mouth daily.    Historical Provider, MD  Multiple Vitamin (MULTIVITAMIN WITH MINERALS) TABS tablet Take 1 tablet by mouth daily.    Historical Provider, MD    Physical Exam: Vitals:   02/26/16 1730 02/26/16 1914 02/26/16 1952 02/26/16 2200  BP: 111/60 135/80 134/68 137/68  Pulse: 73 70 71   Resp: 16 18 17 17   Temp:  98.2 F (36.8 C) 97.7 F (36.5 C) 98 F (36.7 C)  TempSrc:  Oral Oral Oral  SpO2: 92% 94% 99% 97%  Weight:      Height:          Constitutional: Moderately built and nourished. Vitals:   02/26/16 1730 02/26/16 1914 02/26/16 1952 02/26/16 2200  BP: 111/60 135/80 134/68 137/68  Pulse:  73 70 71   Resp: 16 18 17 17   Temp:  98.2 F (36.8 C) 97.7 F (36.5 C) 98 F (36.7 C)  TempSrc:  Oral Oral Oral  SpO2: 92% 94% 99% 97%  Weight:      Height:       Eyes: Anicteric no pallor. ENMT: No discharge from the ears eyes nose or mouth. Neck: No mass felt. No neck rigidity. Respiratory: No rhonchi or crepitations. Cardiovascular: S1-S2 heard no murmurs appreciated. Abdomen: Soft nontender bowel sounds present. No guarding or rigidity. Musculoskeletal: No edema. No joint effusion. Skin: No rash. Skin appears warm. Neurologic: Alert awake oriented to time place and person. Moves all extremities 5 x 5. No facial asymmetry tongue is midline. Pupils are equal and reacting to light. Psychiatric: Appears normal. Normal affect.   Labs on Admission: I have personally reviewed following labs and imaging studies  CBC:  Recent Labs Lab 02/26/16 1234  WBC 5.5  NEUTROABS 3.9  HGB 13.2  HCT 36.5  MCV 92.9  PLT [id number]   Basic Metabolic Panel:  Recent Labs Lab 02/26/16 1234  NA 124*  K 3.5  CL 85*  CO2 26  GLUCOSE 107*  BUN 9  CREATININE 0.73  CALCIUM 9.9   GFR: Estimated Creatinine Clearance: 66.5 mL/min (by C-G formula based on SCr of 0.73 mg/dL). Liver Function Tests:  Recent Labs Lab 02/26/16 1234  AST 44*  ALT 45  ALKPHOS 56  BILITOT 1.0  PROT 7.3  ALBUMIN 4.7   No results for input(s): LIPASE, AMYLASE in the last 168 hours. No results for input(s): AMMONIA in the last 168 hours. Coagulation Profile: No results for input(s): INR, PROTIME in the last 168 hours. Cardiac Enzymes: No results for input(s): CKTOTAL, CKMB, CKMBINDEX, TROPONINI in the last 168 hours. BNP (last 3 results) No results for input(s): PROBNP in the last 8760 hours. HbA1C: No results for input(s): HGBA1C in the last 72 hours. CBG: No results for input(s): GLUCAP in the last 168 hours. Lipid Profile: No results for input(s): CHOL, HDL, LDLCALC, TRIG, CHOLHDL, LDLDIRECT in the last  72 hours. Thyroid Function Tests: No results for input(s): TSH, T4TOTAL, FREET4, T3FREE, THYROIDAB in the last 72 hours. Anemia Panel: No results for input(s): VITAMINB12, FOLATE, FERRITIN, TIBC, IRON, RETICCTPCT in the last 72 hours. Urine analysis:    Component Value Date/Time   COLORURINE YELLOW 02/26/2016 1220   APPEARANCEUR CLEAR 02/26/2016 1220   LABSPEC 1.005 02/26/2016 1220   PHURINE 6.0 02/26/2016 1220   GLUCOSEU NEGATIVE 02/26/2016 1220   [place] 02/26/2016 1220  HGBUR negative 03/16/2007 1506   BILIRUBINUR NEGATIVE 02/26/2016 1220   BILIRUBINUR neg 07/06/2012 1216   KETONESUR 15 (A) 02/26/2016 1220   PROTEINUR NEGATIVE 02/26/2016 1220   UROBILINOGEN negative 07/06/2012 1216   UROBILINOGEN negative 03/16/2007 1506   NITRITE NEGATIVE 02/26/2016 1220   LEUKOCYTESUR NEGATIVE 02/26/2016 1220   Sepsis Labs: @LABRCNTIP (procalcitonin:4,lacticidven:4) )No results found for this or any previous visit (from the past 240 hour(s)).   Radiological Exams on Admission: Ct Angio Head W/cm &/or Wo Cm  Result Date: 02/26/2016 CLINICAL DATA:  Severe headache and speech disturbance over the last 3 days. EXAM: CT ANGIOGRAPHY HEAD AND NECK TECHNIQUE: Multidetector CT imaging of the head and neck was performed using the standard protocol during bolus administration of intravenous contrast. Multiplanar CT image reconstructions and MIPs were obtained to evaluate the vascular anatomy. Carotid stenosis measurements (when applicable) are obtained utilizing NASCET criteria, using the distal internal carotid diameter as the denominator. CONTRAST:  100 cc Isovue 370 COMPARISON:  Head CT earlier same day FINDINGS: CTA NECK FINDINGS Aortic arch: Atherosclerosis of the aortic arch. Branching pattern is normal, with the exception of a small left vertebral artery arising directly from the arch. Right carotid system: Common carotid artery widely patent to the bifurcation. Atherosclerotic calcification at  the carotid bifurcation but without stenosis. Cervical ICA is tortuous but widely patent. Left carotid system: Common carotid artery widely patent to the bifurcation. Atherosclerotic disease at the bifurcation and proximal ICA. 40% stenosis of the proximal ICA. Soft plaque without visible ulceration. Beyond the bulb, the ICA is tortuous but widely patent. Vertebral arteries: Right vertebral artery is dominant. Right vertebral artery origin is widely patent. The vessel is widely patent through the cervical region. As noted above, small left vertebral artery takes origin from the arch and is patent through the cervical region. Skeleton: Ordinary cervical spondylosis. Other neck: No significant finding. Upper chest: Normal Review of the MIP images confirms the above findings CTA HEAD FINDINGS Anterior circulation: Both internal carotid arteries are patent through the skullbase and siphon regions. No stenosis. Anterior and middle cerebral vessels are patent without proximal stenosis, aneurysm or vascular malformation. Posterior circulation: Left vertebral artery terminates in PICA. Right vertebral artery supplies basilar. No basilar stenosis. Posterior circulation branch vessels are normal. Venous sinuses: Patent and normal Anatomic variants: None significant Delayed phase: No abnormal enhancement Review of the MIP images confirms the above findings IMPRESSION: Atherosclerotic disease at both carotid bifurcations. No stenosis on the right. 40% stenosis of the proximal ICA on the left, with soft plaque that could be at greater risk of rupture. No intracranial anterior circulation disease. No posterior circulation disease. Left vertebral artery is a tiny vessel that originates from the arch. No lesions seen to explain headache or speech disturbance. Electronically Signed   By: Nelson Chimes M.D.   On: 02/26/2016 14:05   Ct Head Wo Contrast  Result Date: 02/26/2016 CLINICAL DATA:  Severe parietal headache and pressure  since 02/24/2016, residual pain, visual disturbance, hallucinations, slurred speech, unable to make complete sentences, history hypertension EXAM: CT HEAD WITHOUT CONTRAST TECHNIQUE: Contiguous axial images were obtained from the base of the skull through the vertex without intravenous contrast. Sagittal and coronal MPR images reconstructed from axial data set. COMPARISON:  None FINDINGS: Brain: Generalized atrophy. Normal ventricular morphology. No midline shift or mass effect. Mild small vessel chronic ischemic changes of deep cerebral white matter. No intracranial hemorrhage, mass lesion, or evidence acute infarction. No extra-axial fluid collections. Vascular: Normal appearance Skull:  Intact Sinuses/Orbits: Cleared Other: N/A IMPRESSION: Atrophy with mild small vessel chronic ischemic changes of deep cerebral white matter. No acute intracranial abnormalities. Electronically Signed   By: Lavonia Dana M.D.   On: 02/26/2016 12:04   Ct Angio Neck W Or Wo Contrast  Result Date: 02/26/2016 CLINICAL DATA:  Severe headache and speech disturbance over the last 3 days. EXAM: CT ANGIOGRAPHY HEAD AND NECK TECHNIQUE: Multidetector CT imaging of the head and neck was performed using the standard protocol during bolus administration of intravenous contrast. Multiplanar CT image reconstructions and MIPs were obtained to evaluate the vascular anatomy. Carotid stenosis measurements (when applicable) are obtained utilizing NASCET criteria, using the distal internal carotid diameter as the denominator. CONTRAST:  100 cc Isovue 370 COMPARISON:  Head CT earlier same day FINDINGS: CTA NECK FINDINGS Aortic arch: Atherosclerosis of the aortic arch. Branching pattern is normal, with the exception of a small left vertebral artery arising directly from the arch. Right carotid system: Common carotid artery widely patent to the bifurcation. Atherosclerotic calcification at the carotid bifurcation but without stenosis. Cervical ICA is  tortuous but widely patent. Left carotid system: Common carotid artery widely patent to the bifurcation. Atherosclerotic disease at the bifurcation and proximal ICA. 40% stenosis of the proximal ICA. Soft plaque without visible ulceration. Beyond the bulb, the ICA is tortuous but widely patent. Vertebral arteries: Right vertebral artery is dominant. Right vertebral artery origin is widely patent. The vessel is widely patent through the cervical region. As noted above, small left vertebral artery takes origin from the arch and is patent through the cervical region. Skeleton: Ordinary cervical spondylosis. Other neck: No significant finding. Upper chest: Normal Review of the MIP images confirms the above findings CTA HEAD FINDINGS Anterior circulation: Both internal carotid arteries are patent through the skullbase and siphon regions. No stenosis. Anterior and middle cerebral vessels are patent without proximal stenosis, aneurysm or vascular malformation. Posterior circulation: Left vertebral artery terminates in PICA. Right vertebral artery supplies basilar. No basilar stenosis. Posterior circulation branch vessels are normal. Venous sinuses: Patent and normal Anatomic variants: None significant Delayed phase: No abnormal enhancement Review of the MIP images confirms the above findings IMPRESSION: Atherosclerotic disease at both carotid bifurcations. No stenosis on the right. 40% stenosis of the proximal ICA on the left, with soft plaque that could be at greater risk of rupture. No intracranial anterior circulation disease. No posterior circulation disease. Left vertebral artery is a tiny vessel that originates from the arch. No lesions seen to explain headache or speech disturbance. Electronically Signed   By: Nelson Chimes M.D.   On: 02/26/2016 14:05    EKG: Independently reviewed. Normal sinus rhythm.  Assessment/Plan Principal Problem:   TIA (transient ischemic attack) Active Problems:   HTN  (hypertension)   Slurred speech   Headache   Hyponatremia    1. TIA - discussed with Dr. Cheral Marker on-call neurologist. Advised patient to be placed on Lipitor aspirin and will get MRI brain and 2-D echo. Follow neuro checks. Check hemoglobin A1c and lipid panel. Follow neurology consult. 2. Hyponatremia - suspect dehydration. Check urine sodium and osmolality. Continue gentle hydration and follow metabolic panel closely. Will also check TSH and cortisol levels. 3. Hypertension on beta blockers which will be continued.   DVT prophylaxis: Lovenox. Code Status: Full code.  Family Communication: Patient's husband.  Disposition Plan: Home.  Consults called: Neurology.  Admission status: Observation.    Rise Patience MD [facility] Pager [phone].  If 7PM-7AM, please  contact night-coverage [email or web address] Password [email or web address]  02/26/2016, 10:59 PM

## 2016-02-26 NOTE — Consult Note (Addendum)
Referring Physician: Dr. Alfredia Ferguson    Chief Complaint: Possible left MCA stroke  HPI: [name] is an 66 y.o. female who presents in transfer from OSH for further evaluation of possible TIA versus small left MCA stroke. Her symptoms began on Saturday, when she woke up with the worst headache of her life, rated as 10/10. Analgesics had little effect. Husband noted that she was having word finding difficulty and generally with "trouble getting her words out", but no associated dysarthria. She had no other neurological symptoms, denying facial droop, sensory numbness or limb weakness. She states also no neck pain, chest pain or limb pain. She had blurred vision with worsened visual acuity over her glaucoma baseline. She also states that she experienced visual illusions on Saturday consisting of "swirling together" of images and text on her computer screen "like in a fantasy land". No other illusions and denies hallucinations. She has no history of migraine headache. By Sunday her headache decreased to 5/10 and now it is at 1/10.   At OSH, CT was negative for hemorrhage. CTA was obtained to rule out aneurysm, as ED staff were still concerned about possible SAH despite negative CT. CTA did not reveal aneurysm, but did reveal 40% stenosis of the left ICA origin, per report. She was transferred to [facility] for TIA/CVA work up.   Past Medical History:  Diagnosis Date  . Anxiety   . Borderline hypertension   . Depression   . Hyperlipidemia   . Hypertension     Past Surgical History:  Procedure Laterality Date  . BREAST SURGERY    . CESAREAN SECTION    . CLAVICLE SURGERY     @ 19  . COLONOSCOPY  2006  . COLONOSCOPY  2001   Done for positive stool cards   . CYSTOSCOPY  2007   Dr.MacDiarmid  . FACIAL COSMETIC SURGERY     face lift  . NOSE SURGERY     Fractured from baseball injury   . RHINOPLASTY    . TONSILLECTOMY    . TUBAL LIGATION      Family History  Problem Relation Age of Onset  .  Melanoma Sister   . Cancer Sister     melanoma  . Stroke Mother 38  . Heart attack Father 86   Social History:  reports that she quit smoking about 28 years ago. She has never used smokeless tobacco. She reports that she drinks alcohol. She reports that she uses drugs, including Marijuana.  Allergies: No Known Allergies  Medications:  acetaminophen (TYLENOL) 500 MG tablet Take 1,000 mg by mouth every 6 (six) hours as needed for headache. Historical Provider, MD Needs Review  aspirin 81 MG tablet Take 81 mg by mouth daily.  Historical Provider, MD Needs Review  atenolol (TENORMIN) 25 MG tablet TAKE 1 TABLET BY MOUTH DAILY Lanice Shirts, MD Needs Review  escitalopram (LEXAPRO) 20 MG tablet Take 20 mg by mouth daily.  Historical Provider, MD Needs Review  latanoprost (XALATAN) 0.005 % ophthalmic solution 1 drop at bedtime. Historical Provider, MD Needs Review  LORazepam (ATIVAN) 1 MG tablet Take 1 mg by mouth daily as needed for anxiety.  Historical Provider, MD Needs Review  niacin 100 MG tablet Take 100 mg by mouth daily with breakfast. Historical Provider, MD Needs Review  omeprazole (PRILOSEC) 20 MG capsule Take 20 mg by mouth daily.  Historical Provider, MD Needs Review  progesterone (PROMETRIUM) 100 MG capsule Take 100 mg by mouth daily.  Historical Provider, MD  Needs Review  traZODone (DESYREL) 50 MG tablet Take 100 mg by mouth as directed.  Historical Provider, MD Needs Review  fish oil-omega-3 fatty acids 1000 MG capsule Take 2 g by mouth daily. Historical Provider, MD Needs Review  Multiple Vitamin (MULTIVITAMIN WITH MINERALS) TABS tablet Take 1 tablet by mouth daily. Historical Provider, MD Needs Review   ROS: As per HPI.   Physical Examination: Blood pressure 134/68, pulse 71, temperature 97.7 F (36.5 C), temperature source Oral, resp. rate 17, height 5' 6" (1.676 m), weight 69.4 kg (153 lb), SpO2 99 %.  HEENT: Normocephalic/atraumatic Lungs: No gross wheezing.  Respirations unlabored.  Ext: No edema.   Neurologic Examination: Ment: Alert with normal attention. Intact verbal comprehension, repetition and naming. Fluency intact without word finding difficulty or hesitation; one instance of a subtle phonemic paraphasia is of equivocal significance. [place] is normal. Fully oriented except had repeated difficulty recalling the correct year, otherwise intact to self, location, day, month and circumstance.  CN: PERRL. Visual fields intact. EOMI without nystagmus. V -  Temp sensation intact. VII - no facial droop or asymmetry of palpebral fissures. Hearing intact to conversation. No hypophonia noted. Shoulder shrug symmetric. Tongue midline.  Motor: 5/5 x 4 with normal muscle tone and bulk.  Sensory: Intact to temp and FT in all 4 extremities without extinction.  Reflexes: 2+ biceps, brachioradialis, patellae and achilles bilaterally. Toes downgoing.  Cerebellar: No ataxia with FNF.  Gait: Able to stand with own power. Marches in place without difficulty.   Results for orders placed or performed during the hospital encounter of 02/26/16 (from the past 48 hour(s))  Urinalysis, Routine w reflex microscopic     Status: Abnormal   Collection Time: 02/26/16 12:20 PM  Result Value Ref Range   Color, Urine YELLOW YELLOW   APPearance CLEAR CLEAR   Specific Gravity, Urine 1.005 1.005 - 1.030   pH 6.0 5.0 - 8.0   Glucose, UA NEGATIVE NEGATIVE mg/dL   Hgb urine dipstick NEGATIVE NEGATIVE   Bilirubin Urine NEGATIVE NEGATIVE   Ketones, ur 15 (A) NEGATIVE mg/dL   Protein, ur NEGATIVE NEGATIVE mg/dL   Nitrite NEGATIVE NEGATIVE   Leukocytes, UA NEGATIVE NEGATIVE    Comment: Microscopic not done on urines with negative protein, blood, leukocytes, nitrite, or glucose < 500 mg/dL.  Comprehensive metabolic panel     Status: Abnormal   Collection Time: 02/26/16 12:34 PM  Result Value Ref Range   Sodium 124 (L) 135 - 145 mmol/L   Potassium 3.5 3.5 - 5.1 mmol/L    Chloride 85 (L) 101 - 111 mmol/L   CO2 26 22 - 32 mmol/L   Glucose, Bld 107 (H) 65 - 99 mg/dL   BUN 9 6 - 20 mg/dL   Creatinine, Ser 0.73 0.44 - 1.00 mg/dL   Calcium 9.9 8.9 - 10.3 mg/dL   Total Protein 7.3 6.5 - 8.1 g/dL   Albumin 4.7 3.5 - 5.0 g/dL   AST 44 (H) 15 - 41 U/L   ALT 45 14 - 54 U/L   Alkaline Phosphatase 56 38 - 126 U/L   Total Bilirubin 1.0 0.3 - 1.2 mg/dL   GFR calc non Af Amer >60 >60 mL/min   GFR calc Af Amer >60 >60 mL/min    Comment: (NOTE) The eGFR has been calculated using the CKD EPI equation. This calculation has not been validated in all clinical situations. eGFR's persistently <60 mL/min signify possible Chronic Kidney Disease.    Anion gap 13 5 -  15  CBC with Differential     Status: Abnormal   Collection Time: 02/26/16 12:34 PM  Result Value Ref Range   WBC 5.5 4.0 - 10.5 K/uL   RBC 3.93 3.87 - 5.11 MIL/uL   Hemoglobin 13.2 12.0 - 15.0 g/dL   HCT 36.5 36.0 - 46.0 %   MCV 92.9 78.0 - 100.0 fL   MCH 33.6 26.0 - 34.0 pg   MCHC 36.2 (H) 30.0 - 36.0 g/dL   RDW 12.5 11.5 - 15.5 %   Platelets 239 150 - 400 K/uL   Neutrophils Relative % 71 %   Neutro Abs 3.9 1.7 - 7.7 K/uL   Lymphocytes Relative 16 %   Lymphs Abs 0.9 0.7 - 4.0 K/uL   Monocytes Relative 12 %   Monocytes Absolute 0.7 0.1 - 1.0 K/uL   Eosinophils Relative 1 %   Eosinophils Absolute 0.1 0.0 - 0.7 K/uL   Basophils Relative 0 %   Basophils Absolute 0.0 0.0 - 0.1 K/uL   Ct Angio Head W/cm &/or Wo Cm  Result Date: 02/26/2016 CLINICAL DATA:  Severe headache and speech disturbance over the last 3 days. EXAM: CT ANGIOGRAPHY HEAD AND NECK TECHNIQUE: Multidetector CT imaging of the head and neck was performed using the standard protocol during bolus administration of intravenous contrast. Multiplanar CT image reconstructions and MIPs were obtained to evaluate the vascular anatomy. Carotid stenosis measurements (when applicable) are obtained utilizing NASCET criteria, using the distal internal  carotid diameter as the denominator. CONTRAST:  100 cc Isovue 370 COMPARISON:  Head CT earlier same day FINDINGS: CTA NECK FINDINGS Aortic arch: Atherosclerosis of the aortic arch. Branching pattern is normal, with the exception of a small left vertebral artery arising directly from the arch. Right carotid system: Common carotid artery widely patent to the bifurcation. Atherosclerotic calcification at the carotid bifurcation but without stenosis. Cervical ICA is tortuous but widely patent. Left carotid system: Common carotid artery widely patent to the bifurcation. Atherosclerotic disease at the bifurcation and proximal ICA. 40% stenosis of the proximal ICA. Soft plaque without visible ulceration. Beyond the bulb, the ICA is tortuous but widely patent. Vertebral arteries: Right vertebral artery is dominant. Right vertebral artery origin is widely patent. The vessel is widely patent through the cervical region. As noted above, small left vertebral artery takes origin from the arch and is patent through the cervical region. Skeleton: Ordinary cervical spondylosis. Other neck: No significant finding. Upper chest: Normal Review of the MIP images confirms the above findings CTA HEAD FINDINGS Anterior circulation: Both internal carotid arteries are patent through the skullbase and siphon regions. No stenosis. Anterior and middle cerebral vessels are patent without proximal stenosis, aneurysm or vascular malformation. Posterior circulation: Left vertebral artery terminates in PICA. Right vertebral artery supplies basilar. No basilar stenosis. Posterior circulation branch vessels are normal. Venous sinuses: Patent and normal Anatomic variants: None significant Delayed phase: No abnormal enhancement Review of the MIP images confirms the above findings IMPRESSION: Atherosclerotic disease at both carotid bifurcations. No stenosis on the right. 40% stenosis of the proximal ICA on the left, with soft plaque that could be at  greater risk of rupture. No intracranial anterior circulation disease. No posterior circulation disease. Left vertebral artery is a tiny vessel that originates from the arch. No lesions seen to explain headache or speech disturbance. Electronically Signed   By: Nelson Chimes M.D.   On: 02/26/2016 14:05   Ct Head Wo Contrast  Result Date: 02/26/2016 CLINICAL DATA:  Severe parietal  headache and pressure since 02/24/2016, residual pain, visual disturbance, hallucinations, slurred speech, unable to make complete sentences, history hypertension EXAM: CT HEAD WITHOUT CONTRAST TECHNIQUE: Contiguous axial images were obtained from the base of the skull through the vertex without intravenous contrast. Sagittal and coronal MPR images reconstructed from axial data set. COMPARISON:  None FINDINGS: Brain: Generalized atrophy. Normal ventricular morphology. No midline shift or mass effect. Mild small vessel chronic ischemic changes of deep cerebral white matter. No intracranial hemorrhage, mass lesion, or evidence acute infarction. No extra-axial fluid collections. Vascular: Normal appearance Skull: Intact Sinuses/Orbits: Cleared Other: N/A IMPRESSION: Atrophy with mild small vessel chronic ischemic changes of deep cerebral white matter. No acute intracranial abnormalities. Electronically Signed   By: Lavonia Dana M.D.   On: 02/26/2016 12:04   Ct Angio Neck W Or Wo Contrast  Result Date: 02/26/2016 CLINICAL DATA:  Severe headache and speech disturbance over the last 3 days. EXAM: CT ANGIOGRAPHY HEAD AND NECK TECHNIQUE: Multidetector CT imaging of the head and neck was performed using the standard protocol during bolus administration of intravenous contrast. Multiplanar CT image reconstructions and MIPs were obtained to evaluate the vascular anatomy. Carotid stenosis measurements (when applicable) are obtained utilizing NASCET criteria, using the distal internal carotid diameter as the denominator. CONTRAST:  100 cc Isovue 370  COMPARISON:  Head CT earlier same day FINDINGS: CTA NECK FINDINGS Aortic arch: Atherosclerosis of the aortic arch. Branching pattern is normal, with the exception of a small left vertebral artery arising directly from the arch. Right carotid system: Common carotid artery widely patent to the bifurcation. Atherosclerotic calcification at the carotid bifurcation but without stenosis. Cervical ICA is tortuous but widely patent. Left carotid system: Common carotid artery widely patent to the bifurcation. Atherosclerotic disease at the bifurcation and proximal ICA. 40% stenosis of the proximal ICA. Soft plaque without visible ulceration. Beyond the bulb, the ICA is tortuous but widely patent. Vertebral arteries: Right vertebral artery is dominant. Right vertebral artery origin is widely patent. The vessel is widely patent through the cervical region. As noted above, small left vertebral artery takes origin from the arch and is patent through the cervical region. Skeleton: Ordinary cervical spondylosis. Other neck: No significant finding. Upper chest: Normal Review of the MIP images confirms the above findings CTA HEAD FINDINGS Anterior circulation: Both internal carotid arteries are patent through the skullbase and siphon regions. No stenosis. Anterior and middle cerebral vessels are patent without proximal stenosis, aneurysm or vascular malformation. Posterior circulation: Left vertebral artery terminates in PICA. Right vertebral artery supplies basilar. No basilar stenosis. Posterior circulation branch vessels are normal. Venous sinuses: Patent and normal Anatomic variants: None significant Delayed phase: No abnormal enhancement Review of the MIP images confirms the above findings IMPRESSION: Atherosclerotic disease at both carotid bifurcations. No stenosis on the right. 40% stenosis of the proximal ICA on the left, with soft plaque that could be at greater risk of rupture. No intracranial anterior circulation disease.  No posterior circulation disease. Left vertebral artery is a tiny vessel that originates from the arch. No lesions seen to explain headache or speech disturbance. Electronically Signed   By: Nelson Chimes M.D.   On: 02/26/2016 14:05    Assessment: 66 y.o. female with transient dysphasia and 40% stenosis of left ICA origin on CTA. 1. History of symptoms best localize as left perisylvian cortical dysfunction. No history of seizure like activity. Symptoms most likely secondary to TIA, although presentation with headache is atypical.  2. Subarachnoid hemorrhage ruled out  at OSH with CT and CTA (CT followed by CT angiography (CTA) of the brain can rule out SAH with greater than 99% sensitivity).  3. Stroke risk factors: HTN, HLD and age.  4. Hyponatremia. Another possibility regarding the underlying etiology for her symptoms. Current Na is 124, but may have been lower at the time of symptom onset.  5. Stenosis at left ICA origin felt by Radiology to be due to soft plaque, which would be at increased risk of rupture.   Plan: 1. HgbA1c, fasting lipid panel 2. MRI brain without contrast 3. PT consult, OT consult, Speech consult 4. Echocardiogram 5. Carotid dopplers 6. Continue ASA.  7. Start atorvastatin 40 mg po qd 8. Telemetry monitoring 9. Frequent neuro checks 10. Gradual correction of hyponatremia. Correct no faster than 0.5 meq/L/hr or 10 meq/L/day.  11. Evaluate for underlying etiology of hyponatremia and low Cl.  12. Vascular surgery consult.   _0  signed: Dr. Kerney Elbe  02/26/2016, 9:44 PM

## 2016-02-26 NOTE — ED Notes (Signed)
ED Provider at bedside. 

## 2016-02-27 ENCOUNTER — Encounter (HOSPITAL_COMMUNITY): Payer: Self-pay | Admitting: *Deleted

## 2016-02-27 ENCOUNTER — Observation Stay (HOSPITAL_BASED_OUTPATIENT_CLINIC_OR_DEPARTMENT_OTHER): Payer: BLUE CROSS/BLUE SHIELD

## 2016-02-27 ENCOUNTER — Observation Stay (HOSPITAL_COMMUNITY): Payer: BLUE CROSS/BLUE SHIELD

## 2016-02-27 DIAGNOSIS — R51 Headache: Secondary | ICD-10-CM | POA: Diagnosis not present

## 2016-02-27 DIAGNOSIS — I10 Essential (primary) hypertension: Secondary | ICD-10-CM | POA: Diagnosis not present

## 2016-02-27 DIAGNOSIS — G459 Transient cerebral ischemic attack, unspecified: Secondary | ICD-10-CM | POA: Diagnosis not present

## 2016-02-27 DIAGNOSIS — G458 Other transient cerebral ischemic attacks and related syndromes: Secondary | ICD-10-CM | POA: Diagnosis not present

## 2016-02-27 DIAGNOSIS — G43909 Migraine, unspecified, not intractable, without status migrainosus: Secondary | ICD-10-CM

## 2016-02-27 DIAGNOSIS — G43009 Migraine without aura, not intractable, without status migrainosus: Secondary | ICD-10-CM | POA: Diagnosis not present

## 2016-02-27 LAB — COMPREHENSIVE METABOLIC PANEL
ALBUMIN: 3.8 g/dL (ref 3.5–5.0)
ALK PHOS: 46 U/L (ref 38–126)
ALT: 45 U/L (ref 14–54)
ANION GAP: 15 (ref 5–15)
AST: 45 U/L — AB (ref 15–41)
BILIRUBIN TOTAL: 0.6 mg/dL (ref 0.3–1.2)
BUN: 6 mg/dL (ref 6–20)
CALCIUM: 9.6 mg/dL (ref 8.9–10.3)
CO2: 25 mmol/L (ref 22–32)
Chloride: 94 mmol/L — ABNORMAL LOW (ref 101–111)
Creatinine, Ser: 0.64 mg/dL (ref 0.44–1.00)
GFR calc Af Amer: >60 mL/min (ref >60)
GFR calc non Af Amer: >60 mL/min (ref >60)
GLUCOSE: 86 mg/dL (ref 65–99)
Potassium: 3.5 mmol/L (ref 3.5–5.1)
SODIUM: 134 mmol/L — AB (ref 135–145)
TOTAL PROTEIN: 6.1 g/dL — AB (ref 6.5–8.1)

## 2016-02-27 LAB — BASIC METABOLIC PANEL
Anion gap: 14 (ref 5–15)
Anion gap: 14 (ref 5–15)
BUN: 5 mg/dL — ABNORMAL LOW (ref 6–20)
BUN: 6 mg/dL (ref 6–20)
CALCIUM: 9.4 mg/dL (ref 8.9–10.3)
CALCIUM: 9.5 mg/dL (ref 8.9–10.3)
CHLORIDE: 91 mmol/L — AB (ref 101–111)
CHLORIDE: 95 mmol/L — AB (ref 101–111)
CO2: 25 mmol/L (ref 22–32)
CO2: 25 mmol/L (ref 22–32)
CREATININE: 0.59 mg/dL (ref 0.44–1.00)
CREATININE: 0.62 mg/dL (ref 0.44–1.00)
GFR calc Af Amer: >60 mL/min (ref >60)
GFR calc Af Amer: >60 mL/min (ref >60)
GFR calc non Af Amer: >60 mL/min (ref >60)
GFR calc non Af Amer: >60 mL/min (ref >60)
Glucose, Bld: 100 mg/dL — ABNORMAL HIGH (ref 65–99)
Glucose, Bld: 92 mg/dL (ref 65–99)
Potassium: 3.4 mmol/L — ABNORMAL LOW (ref 3.5–5.1)
Potassium: 3.6 mmol/L (ref 3.5–5.1)
SODIUM: 130 mmol/L — AB (ref 135–145)
Sodium: 134 mmol/L — ABNORMAL LOW (ref 135–145)

## 2016-02-27 LAB — CORTISOL: Cortisol, Plasma: 5.2 ug/dL

## 2016-02-27 LAB — ECHOCARDIOGRAM COMPLETE
Height: 66 in
WEIGHTICAEL: 2448 [oz_av]

## 2016-02-27 LAB — CBC
HEMATOCRIT: 35.6 % — AB (ref 36.0–46.0)
HEMOGLOBIN: 12.5 g/dL (ref 12.0–15.0)
MCH: 33.1 pg (ref 26.0–34.0)
MCHC: 35.1 g/dL (ref 30.0–36.0)
MCV: 94.2 fL (ref 78.0–100.0)
Platelets: 200 10*3/uL (ref 150–400)
RBC: 3.78 MIL/uL — ABNORMAL LOW (ref 3.87–5.11)
RDW: 13.3 % (ref 11.5–15.5)
WBC: 5.4 10*3/uL (ref 4.0–10.5)

## 2016-02-27 LAB — LIPID PANEL
CHOL/HDL RATIO: 2.8 ratio
Cholesterol: 282 mg/dL — ABNORMAL HIGH (ref 0–200)
HDL: 99 mg/dL (ref >40)
LDL CALC: 165 mg/dL — AB (ref 0–99)
TRIGLYCERIDES: 90 mg/dL (ref <150)
VLDL: 18 mg/dL (ref 0–40)

## 2016-02-27 LAB — TSH: TSH: 1.18 u[IU]/mL (ref 0.350–4.500)

## 2016-02-27 LAB — OSMOLALITY: Osmolality: 295 mOsm/kg (ref 275–295)

## 2016-02-27 MED ORDER — ATORVASTATIN CALCIUM 40 MG PO TABS
40.0000 mg | ORAL_TABLET | Freq: Every day | ORAL | Status: DC
  Administered 2016-02-27: 40 mg via ORAL
  Filled 2016-02-27: qty 1

## 2016-02-27 MED ORDER — LORAZEPAM 2 MG/ML IJ SOLN
1.0000 mg | Freq: Once | INTRAMUSCULAR | Status: AC
  Administered 2016-02-27: 1 mg via INTRAVENOUS
  Filled 2016-02-27: qty 1

## 2016-02-27 NOTE — Progress Notes (Signed)
  Echocardiogram 2D Echocardiogram has been performed.  Molly Hubbard 02/27/2016, 11:16 AM

## 2016-02-27 NOTE — Progress Notes (Addendum)
STROKE TEAM PROGRESS NOTE   HISTORY OF PRESENT ILLNESS (per record) Molly Hubbard is an 66 y.o. female who presents in transfer from OSH for further evaluation of possible TIA versus small left MCA stroke. Her symptoms began on Saturday, when she woke up with the worst headache of her life, rated as 10/10. Analgesics had little effect. Husband noted that she was having word finding difficulty and generally with "trouble getting her words out", but no associated dysarthria. She had no other neurological symptoms, denying facial droop, sensory numbness or limb weakness. She states also no neck pain, chest pain or limb pain. She had blurred vision with worsened visual acuity over her glaucoma baseline. She also states that she experienced visual illusions on Saturday consisting of "swirling together" of images and text on her computer screen "like in a fantasy land". No other illusions and denies hallucinations. She has no history of migraine headache. By Sunday her headache decreased to 5/10 and now it is at 1/10.   At OSH, CT was negative for hemorrhage. CTA was obtained to rule out aneurysm, as ED staff were still concerned about possible SAH despite negative CT. CTA did not reveal aneurysm, but did reveal 40% stenosis of the left ICA origin, per report. She was transferred to [facility] for TIA/CVA work up.  Patient was not administered IV t-PA. She was admitted for further evaluation and treatment.   SUBJECTIVE (INTERVAL HISTORY) Her husband and son are at the bedside (son left as he was on a phone call).  Overall she feels her condition is stable. She then has been recounted history of present illness, including severe headache.   OBJECTIVE Temp:  [97.7 F (36.5 C)-98.3 F (36.8 C)] (P) 98 F (36.7 C) (02/06 0800) Pulse Rate:  [69-94] (P) 70 (02/06 0800) Cardiac Rhythm: Normal sinus rhythm;Heart block (02/06 0700) Resp:  [12-18] (P) 18 (02/06 0800) BP: (111-161)/(60-94) (P) 149/70 (02/06  0800) SpO2:  [92 %-100 %] (P) 98 % (02/06 0800) Weight:  [69.4 kg (153 lb)] 69.4 kg (153 lb) (02/05 1130)  CBC:  Recent Labs Lab 02/26/16 1234 02/26/16 2335 02/27/16 0255  WBC 5.5 5.7 5.4  NEUTROABS 3.9  --   --   HGB 13.2 12.4 12.5  HCT 36.5 35.3* 35.6*  MCV 92.9 93.6 94.2  PLT 239 206 [id number]    Basic Metabolic Panel:  Recent Labs Lab 02/27/16 0255 02/27/16 0545  NA 134* 134*  K 3.5 3.6  CL 94* 95*  CO2 25 25  GLUCOSE 86 92  BUN 6 6  CREATININE 0.64 0.59  CALCIUM 9.6 9.4    Lipid Panel:    Component Value Date/Time   CHOL 282 (H) 02/27/2016 0255   TRIG 90 02/27/2016 0255   HDL 99 02/27/2016 0255   CHOLHDL 2.8 02/27/2016 0255   VLDL 18 02/27/2016 0255   LDLCALC 165 (H) 02/27/2016 0255   HgbA1c:  Lab Results  Component Value Date   HGBA1C 5.5 07/01/2012   Urine Drug Screen:    Component Value Date/Time   LABOPIA NONE DETECTED 02/26/2016 2210   COCAINSCRNUR NONE DETECTED 02/26/2016 2210   LABBENZ NONE DETECTED 02/26/2016 2210   AMPHETMU NONE DETECTED 02/26/2016 2210   THCU NONE DETECTED 02/26/2016 2210   LABBARB NONE DETECTED 02/26/2016 2210      IMAGING  Ct Angio Head W/cm &/or Wo Cm  Result Date: 02/26/2016 CLINICAL DATA:  Severe headache and speech disturbance over the last 3 days. EXAM: CT ANGIOGRAPHY HEAD AND NECK TECHNIQUE:  Multidetector CT imaging of the head and neck was performed using the standard protocol during bolus administration of intravenous contrast. Multiplanar CT image reconstructions and MIPs were obtained to evaluate the vascular anatomy. Carotid stenosis measurements (when applicable) are obtained utilizing NASCET criteria, using the distal internal carotid diameter as the denominator. CONTRAST:  100 cc Isovue 370 COMPARISON:  Head CT earlier same day FINDINGS: CTA NECK FINDINGS Aortic arch: Atherosclerosis of the aortic arch. Branching pattern is normal, with the exception of a small left vertebral artery arising directly from the arch.  Right carotid system: Common carotid artery widely patent to the bifurcation. Atherosclerotic calcification at the carotid bifurcation but without stenosis. Cervical ICA is tortuous but widely patent. Left carotid system: Common carotid artery widely patent to the bifurcation. Atherosclerotic disease at the bifurcation and proximal ICA. 40% stenosis of the proximal ICA. Soft plaque without visible ulceration. Beyond the bulb, the ICA is tortuous but widely patent. Vertebral arteries: Right vertebral artery is dominant. Right vertebral artery origin is widely patent. The vessel is widely patent through the cervical region. As noted above, small left vertebral artery takes origin from the arch and is patent through the cervical region. Skeleton: Ordinary cervical spondylosis. Other neck: No significant finding. Upper chest: Normal Review of the MIP images confirms the above findings CTA HEAD FINDINGS Anterior circulation: Both internal carotid arteries are patent through the skullbase and siphon regions. No stenosis. Anterior and middle cerebral vessels are patent without proximal stenosis, aneurysm or vascular malformation. Posterior circulation: Left vertebral artery terminates in PICA. Right vertebral artery supplies basilar. No basilar stenosis. Posterior circulation branch vessels are normal. Venous sinuses: Patent and normal Anatomic variants: None significant Delayed phase: No abnormal enhancement Review of the MIP images confirms the above findings IMPRESSION: Atherosclerotic disease at both carotid bifurcations. No stenosis on the right. 40% stenosis of the proximal ICA on the left, with soft plaque that could be at greater risk of rupture. No intracranial anterior circulation disease. No posterior circulation disease. Left vertebral artery is a tiny vessel that originates from the arch. No lesions seen to explain headache or speech disturbance. Electronically Signed   By: Nelson Chimes M.D.   On: 02/26/2016  14:05   Ct Head Wo Contrast  Result Date: 02/26/2016 CLINICAL DATA:  Severe parietal headache and pressure since 02/24/2016, residual pain, visual disturbance, hallucinations, slurred speech, unable to make complete sentences, history hypertension EXAM: CT HEAD WITHOUT CONTRAST TECHNIQUE: Contiguous axial images were obtained from the base of the skull through the vertex without intravenous contrast. Sagittal and coronal MPR images reconstructed from axial data set. COMPARISON:  None FINDINGS: Brain: Generalized atrophy. Normal ventricular morphology. No midline shift or mass effect. Mild small vessel chronic ischemic changes of deep cerebral white matter. No intracranial hemorrhage, mass lesion, or evidence acute infarction. No extra-axial fluid collections. Vascular: Normal appearance Skull: Intact Sinuses/Orbits: Cleared Other: N/A IMPRESSION: Atrophy with mild small vessel chronic ischemic changes of deep cerebral white matter. No acute intracranial abnormalities. Electronically Signed   By: Lavonia Dana M.D.   On: 02/26/2016 12:04   Ct Angio Neck W Or Wo Contrast  Result Date: 02/26/2016 CLINICAL DATA:  Severe headache and speech disturbance over the last 3 days. EXAM: CT ANGIOGRAPHY HEAD AND NECK TECHNIQUE: Multidetector CT imaging of the head and neck was performed using the standard protocol during bolus administration of intravenous contrast. Multiplanar CT image reconstructions and MIPs were obtained to evaluate the vascular anatomy. Carotid stenosis measurements (when applicable) are  obtained utilizing NASCET criteria, using the distal internal carotid diameter as the denominator. CONTRAST:  100 cc Isovue 370 COMPARISON:  Head CT earlier same day FINDINGS: CTA NECK FINDINGS Aortic arch: Atherosclerosis of the aortic arch. Branching pattern is normal, with the exception of a small left vertebral artery arising directly from the arch. Right carotid system: Common carotid artery widely patent to the  bifurcation. Atherosclerotic calcification at the carotid bifurcation but without stenosis. Cervical ICA is tortuous but widely patent. Left carotid system: Common carotid artery widely patent to the bifurcation. Atherosclerotic disease at the bifurcation and proximal ICA. 40% stenosis of the proximal ICA. Soft plaque without visible ulceration. Beyond the bulb, the ICA is tortuous but widely patent. Vertebral arteries: Right vertebral artery is dominant. Right vertebral artery origin is widely patent. The vessel is widely patent through the cervical region. As noted above, small left vertebral artery takes origin from the arch and is patent through the cervical region. Skeleton: Ordinary cervical spondylosis. Other neck: No significant finding. Upper chest: Normal Review of the MIP images confirms the above findings CTA HEAD FINDINGS Anterior circulation: Both internal carotid arteries are patent through the skullbase and siphon regions. No stenosis. Anterior and middle cerebral vessels are patent without proximal stenosis, aneurysm or vascular malformation. Posterior circulation: Left vertebral artery terminates in PICA. Right vertebral artery supplies basilar. No basilar stenosis. Posterior circulation branch vessels are normal. Venous sinuses: Patent and normal Anatomic variants: None significant Delayed phase: No abnormal enhancement Review of the MIP images confirms the above findings IMPRESSION: Atherosclerotic disease at both carotid bifurcations. No stenosis on the right. 40% stenosis of the proximal ICA on the left, with soft plaque that could be at greater risk of rupture. No intracranial anterior circulation disease. No posterior circulation disease. Left vertebral artery is a tiny vessel that originates from the arch. No lesions seen to explain headache or speech disturbance. Electronically Signed   By: Nelson Chimes M.D.   On: 02/26/2016 14:05   Mr Brain Wo Contrast  Result Date: 02/27/2016 CLINICAL  DATA:  66 y/o  F; headache and speech problems. EXAM: MRI HEAD WITHOUT CONTRAST TECHNIQUE: Multiplanar, multiecho pulse sequences of the brain and surrounding structures were obtained without intravenous contrast. COMPARISON:  02/26/2016 CT head. CT angiogram head dated 02/26/2016. FINDINGS: Brain: No acute infarction, hemorrhage, hydrocephalus, extra-axial collection or mass lesion. No significant T2 FLAIR signal abnormality. Mild brain parenchymal volume loss for age. Vascular: Normal flow voids. Skull and upper cervical spine: Normal marrow signal. Sinuses/Orbits: Negative. Other: None. IMPRESSION: No acute intracranial abnormality identified. Unremarkable MRI of the brain for age. Electronically Signed   By: Kristine Garbe M.D.   On: 02/27/2016 04:51   2D Echocardiogram  EF 55-60% with no source of embolus.    PHYSICAL EXAM Pleasant middle aged anxious Caucasian lady. . Afebrile. Head is nontraumatic. Neck is supple without bruit.    Cardiac exam no murmur or gallop. Lungs are clear to auscultation. Distal pulses are well felt. Neurological Exam ;  Awake  Alert oriented x 3. Normal speech and language.eye movements full without nystagmus.fundi were not visualized. Vision acuity and fields appear normal. Hearing is normal. Palatal movements are normal. Face symmetric. Tongue midline. Normal strength, tone, reflexes and coordination. Normal sensation. Gait deferred. ASSESSMENT/PLAN Ms. Molly Hubbard is a 66 y.o. female with history of hypertension presenting with headache and blurred vision, slurred speech. She did not receive IV t-PA.   Complicated Migraine likely, doubt TIA. Reasonable to complete TIA  workup  MRI  NO ACUTE STROKE  CT angiogram head and neck  atherosclerotic disease at both carotid bifurcations. Left ICA 40% stenosis with soft plaque  2D Echo  pending   LDL 165  HgbA1c pending  Lovenox 40 mg sq daily for VTE prophylaxis  Diet Heart Room service  appropriate? Yes; Fluid consistency: Thin  aspirin 81 mg daily prior to admission, now on aspirin 325 mg daily.   Patient counseled to be compliant with her antithrombotic medications  Ongoing aggressive stroke risk factor management  Therapy recommendations:  No therapy needs  Disposition:  Return home  Okay for discharge once workup completed   No neurology follow-up indicated at this time. Can consider in the future should the headaches become an ongoing issue.  Hypertension  Stable  BP goal normotensive  Hyperlipidemia  Home meds:  No statin  LDL 165  Now on Lipitor 40 mg daily   Continue statin at discharge  Other Stroke Risk Factors  ETOH use, advised to drink no more than 1 drink(s) a day  Family hx stroke (mother)  No hx migraines  Other Active Problems  hyponatremia  Hospital day # 0  BIBY,SHARON  [place] for Pager information 02/27/2016 1:08 PM  I have personally examined this patient, reviewed notes, independently viewed imaging studies, participated in medical decision making and plan of care.ROS completed by me personally and pertinent positives fully documented  I have made any additions or clarifications directly to the above note. Agree with note above.. I had a long discussion the patient and husband regarding her presentation which seems most typical for Complicated migraine but it is unusual since she denies prior history of migraines. If she has recurrent headaches or similar episodes may consider migraine prophylaxis. Greater than 50% time during this 35 minute visit was spent on counseling and coordination of care about Complicated migraine, TIA and stroke risk, prevention and treatment Antony Contras, MD Medical Director [place] Pager: [phone] 02/27/2016 1:29 PM  To contact Stroke Continuity provider, please refer to [email or web address] After hours, contact General Neurology

## 2016-02-27 NOTE — Discharge Summary (Addendum)
Physician Discharge Summary  Molly Hubbard [id number] DOB: 1950-08-03 DOA: 02/26/2016  PCP: Kelton Pillar, MD  Admit date: 02/26/2016 Discharge date: 02/27/2016  Admitted From: Home  Disposition:  Home  Recommendations for Outpatient Follow-up:  1. Follow up with PCP in 1-week   Home Health: No  Equipment/Devices: No   Discharge Condition: stable  CODE STATUS: full  Diet recommendation: Heart Healthy   Brief/Interim Summary: This is a 66 yo female who presented to the hospital with the chief complain of headache, blurred vision and slurred speech. Her symptoms happen about 72 hours before admission. She woke from her sleep with a headache, or vision and difficulty speaking. Her symptoms lasted for 24 hours with slow improvement. Patient contacted her physician who advised her to go to emergency room. On the physical examination blood pressure 111/60, heart rate 73, respiratory rate 16, oxygen saturation 94%. Clinically she was nonfocal. Her chemistry and cell count were normal. Urinalysis was negative for infection. UDS was negative. CT head negative for acute findings. EKG was normal sinus rhythm.  The patient was admitted to the hospital working diagnosis of transient ischemic attack.  1. Typical complicated migraine. Stroke workup included MRI which was negative, echocardiography with normal LV function, no signs of embolic phenomena. CT angiography with 40% stenosis in the proximal ICA on the left. The patient was seen by neurology, possible diagnosis of typical complicated migraine. Patient will discharge home with no change in her medical regimen.  * Patient ruled out for TIA.   2. Hypertension. Patient will continue atenolol.  3. Depression. Continue trazodone, lorazepam and Lexapro.  Discharge Diagnoses:  Principal Problem:   TIA (transient ischemic attack) Active Problems:   HTN (hypertension)   Slurred speech   Headache   Hyponatremia    Discharge  Instructions  Discharge Instructions    Diet - low sodium heart healthy    Complete by:  As directed    Increase activity slowly    Complete by:  As directed      Allergies as of 02/27/2016   No Known Allergies     Medication List    TAKE these medications   acetaminophen 500 MG tablet Commonly known as:  TYLENOL Take 1,000 mg by mouth every 6 (six) hours as needed for headache.   aspirin 81 MG tablet Take 81 mg by mouth daily.   atenolol 25 MG tablet Commonly known as:  TENORMIN TAKE 1 TABLET BY MOUTH DAILY   escitalopram 20 MG tablet Commonly known as:  LEXAPRO Take 20 mg by mouth daily.   fish oil-omega-3 fatty acids 1000 MG capsule Take 2 g by mouth daily.   latanoprost 0.005 % ophthalmic solution Commonly known as:  XALATAN 1 drop at bedtime.   LORazepam 1 MG tablet Commonly known as:  ATIVAN Take 1 mg by mouth daily as needed for anxiety.   multivitamin with minerals Tabs tablet Take 1 tablet by mouth daily.   niacin 100 MG tablet Take 100 mg by mouth daily with breakfast.   omeprazole 20 MG capsule Commonly known as:  PRILOSEC Take 20 mg by mouth daily.   PROMETRIUM 100 MG capsule Generic drug:  progesterone Take 100 mg by mouth daily.   traZODone 50 MG tablet Commonly known as:  DESYREL Take 100 mg by mouth as directed.       No Known Allergies   Consultations:  Neurology    Procedures/Studies: Ct Angio Head W/cm &/or Wo Cm  Result Date: 02/26/2016 CLINICAL DATA:  Severe headache and speech disturbance over the last 3 days. EXAM: CT ANGIOGRAPHY HEAD AND NECK TECHNIQUE: Multidetector CT imaging of the head and neck was performed using the standard protocol during bolus administration of intravenous contrast. Multiplanar CT image reconstructions and MIPs were obtained to evaluate the vascular anatomy. Carotid stenosis measurements (when applicable) are obtained utilizing NASCET criteria, using the distal internal carotid diameter as the  denominator. CONTRAST:  100 cc Isovue 370 COMPARISON:  Head CT earlier same day FINDINGS: CTA NECK FINDINGS Aortic arch: Atherosclerosis of the aortic arch. Branching pattern is normal, with the exception of a small left vertebral artery arising directly from the arch. Right carotid system: Common carotid artery widely patent to the bifurcation. Atherosclerotic calcification at the carotid bifurcation but without stenosis. Cervical ICA is tortuous but widely patent. Left carotid system: Common carotid artery widely patent to the bifurcation. Atherosclerotic disease at the bifurcation and proximal ICA. 40% stenosis of the proximal ICA. Soft plaque without visible ulceration. Beyond the bulb, the ICA is tortuous but widely patent. Vertebral arteries: Right vertebral artery is dominant. Right vertebral artery origin is widely patent. The vessel is widely patent through the cervical region. As noted above, small left vertebral artery takes origin from the arch and is patent through the cervical region. Skeleton: Ordinary cervical spondylosis. Other neck: No significant finding. Upper chest: Normal Review of the MIP images confirms the above findings CTA HEAD FINDINGS Anterior circulation: Both internal carotid arteries are patent through the skullbase and siphon regions. No stenosis. Anterior and middle cerebral vessels are patent without proximal stenosis, aneurysm or vascular malformation. Posterior circulation: Left vertebral artery terminates in PICA. Right vertebral artery supplies basilar. No basilar stenosis. Posterior circulation branch vessels are normal. Venous sinuses: Patent and normal Anatomic variants: None significant Delayed phase: No abnormal enhancement Review of the MIP images confirms the above findings IMPRESSION: Atherosclerotic disease at both carotid bifurcations. No stenosis on the right. 40% stenosis of the proximal ICA on the left, with soft plaque that could be at greater risk of rupture. No  intracranial anterior circulation disease. No posterior circulation disease. Left vertebral artery is a tiny vessel that originates from the arch. No lesions seen to explain headache or speech disturbance. Electronically Signed   By: Nelson Chimes M.D.   On: 02/26/2016 14:05   Ct Head Wo Contrast  Result Date: 02/26/2016 CLINICAL DATA:  Severe parietal headache and pressure since 02/24/2016, residual pain, visual disturbance, hallucinations, slurred speech, unable to make complete sentences, history hypertension EXAM: CT HEAD WITHOUT CONTRAST TECHNIQUE: Contiguous axial images were obtained from the base of the skull through the vertex without intravenous contrast. Sagittal and coronal MPR images reconstructed from axial data set. COMPARISON:  None FINDINGS: Brain: Generalized atrophy. Normal ventricular morphology. No midline shift or mass effect. Mild small vessel chronic ischemic changes of deep cerebral white matter. No intracranial hemorrhage, mass lesion, or evidence acute infarction. No extra-axial fluid collections. Vascular: Normal appearance Skull: Intact Sinuses/Orbits: Cleared Other: N/A IMPRESSION: Atrophy with mild small vessel chronic ischemic changes of deep cerebral white matter. No acute intracranial abnormalities. Electronically Signed   By: Lavonia Dana M.D.   On: 02/26/2016 12:04   Ct Angio Neck W Or Wo Contrast  Result Date: 02/26/2016 CLINICAL DATA:  Severe headache and speech disturbance over the last 3 days. EXAM: CT ANGIOGRAPHY HEAD AND NECK TECHNIQUE: Multidetector CT imaging of the head and neck was performed using the standard protocol during bolus administration of intravenous contrast. Multiplanar CT  image reconstructions and MIPs were obtained to evaluate the vascular anatomy. Carotid stenosis measurements (when applicable) are obtained utilizing NASCET criteria, using the distal internal carotid diameter as the denominator. CONTRAST:  100 cc Isovue 370 COMPARISON:  Head CT  earlier same day FINDINGS: CTA NECK FINDINGS Aortic arch: Atherosclerosis of the aortic arch. Branching pattern is normal, with the exception of a small left vertebral artery arising directly from the arch. Right carotid system: Common carotid artery widely patent to the bifurcation. Atherosclerotic calcification at the carotid bifurcation but without stenosis. Cervical ICA is tortuous but widely patent. Left carotid system: Common carotid artery widely patent to the bifurcation. Atherosclerotic disease at the bifurcation and proximal ICA. 40% stenosis of the proximal ICA. Soft plaque without visible ulceration. Beyond the bulb, the ICA is tortuous but widely patent. Vertebral arteries: Right vertebral artery is dominant. Right vertebral artery origin is widely patent. The vessel is widely patent through the cervical region. As noted above, small left vertebral artery takes origin from the arch and is patent through the cervical region. Skeleton: Ordinary cervical spondylosis. Other neck: No significant finding. Upper chest: Normal Review of the MIP images confirms the above findings CTA HEAD FINDINGS Anterior circulation: Both internal carotid arteries are patent through the skullbase and siphon regions. No stenosis. Anterior and middle cerebral vessels are patent without proximal stenosis, aneurysm or vascular malformation. Posterior circulation: Left vertebral artery terminates in PICA. Right vertebral artery supplies basilar. No basilar stenosis. Posterior circulation branch vessels are normal. Venous sinuses: Patent and normal Anatomic variants: None significant Delayed phase: No abnormal enhancement Review of the MIP images confirms the above findings IMPRESSION: Atherosclerotic disease at both carotid bifurcations. No stenosis on the right. 40% stenosis of the proximal ICA on the left, with soft plaque that could be at greater risk of rupture. No intracranial anterior circulation disease. No posterior  circulation disease. Left vertebral artery is a tiny vessel that originates from the arch. No lesions seen to explain headache or speech disturbance. Electronically Signed   By: Nelson Chimes M.D.   On: 02/26/2016 14:05   Mr Brain Wo Contrast  Result Date: 02/27/2016 CLINICAL DATA:  65 y/o  F; headache and speech problems. EXAM: MRI HEAD WITHOUT CONTRAST TECHNIQUE: Multiplanar, multiecho pulse sequences of the brain and surrounding structures were obtained without intravenous contrast. COMPARISON:  02/26/2016 CT head. CT angiogram head dated 02/26/2016. FINDINGS: Brain: No acute infarction, hemorrhage, hydrocephalus, extra-axial collection or mass lesion. No significant T2 FLAIR signal abnormality. Mild brain parenchymal volume loss for age. Vascular: Normal flow voids. Skull and upper cervical spine: Normal marrow signal. Sinuses/Orbits: Negative. Other: None. IMPRESSION: No acute intracranial abnormality identified. Unremarkable MRI of the brain for age. Electronically Signed   By: Kristine Garbe M.D.   On: 02/27/2016 04:51    (Echo, Carotid, EGD, Colonoscopy, ERCP)    Subjective: Patient feeling well, no focal deficit, no nausea or vomiting.   Discharge Exam: Vitals:   02/27/16 0800 02/27/16 1332  BP: (!) 149/70 (!) 153/78  Pulse: 70 66  Resp: 18 16  Temp: 98 F (36.7 C) 98.1 F (36.7 C)   Vitals:   02/26/16 2200 02/27/16 0000 02/27/16 0800 02/27/16 1332  BP: 137/68 (!) 143/71 (!) 149/70 (!) 153/78  Pulse:  72 70 66  Resp: 17 18 18 16   Temp: 98 F (36.7 C) 98.3 F (36.8 C) 98 F (36.7 C) 98.1 F (36.7 C)  TempSrc: Oral Oral Oral Oral  SpO2: 97% 97% 98% 99%  Weight:      Height:        General: Pt is alert, awake, not in acute distress Cardiovascular: RRR, S1/S2 +, no rubs, no gallops Respiratory: CTA bilaterally, no wheezing, no rhonchi Abdominal: Soft, NT, ND, bowel sounds + Extremities: no edema, no cyanosis    The results of significant diagnostics from  this hospitalization (including imaging, microbiology, ancillary and laboratory) are listed below for reference.     Microbiology: No results found for this or any previous visit (from the past 240 hour(s)).   Labs: BNP (last 3 results) No results for input(s): BNP in the last 8760 hours. Basic Metabolic Panel:  Recent Labs Lab 02/26/16 1234 02/26/16 2335 02/27/16 0255 02/27/16 0545  NA 124* 130* 134* 134*  K 3.5 3.4* 3.5 3.6  CL 85* 91* 94* 95*  CO2 26 25 25 25   GLUCOSE 107* 100* 86 92  BUN 9 5* 6 6  CREATININE 0.73 0.62 0.64 0.59  CALCIUM 9.9 9.5 9.6 9.4   Liver Function Tests:  Recent Labs Lab 02/26/16 1234 02/27/16 0255  AST 44* 45*  ALT 45 45  ALKPHOS 56 46  BILITOT 1.0 0.6  PROT 7.3 6.1*  ALBUMIN 4.7 3.8   No results for input(s): LIPASE, AMYLASE in the last 168 hours. No results for input(s): AMMONIA in the last 168 hours. CBC:  Recent Labs Lab 02/26/16 1234 02/26/16 2335 02/27/16 0255  WBC 5.5 5.7 5.4  NEUTROABS 3.9  --   --   HGB 13.2 12.4 12.5  HCT 36.5 35.3* 35.6*  MCV 92.9 93.6 94.2  PLT 239 206 200   Cardiac Enzymes: No results for input(s): CKTOTAL, CKMB, CKMBINDEX, TROPONINI in the last 168 hours. BNP: Invalid input(s): POCBNP CBG: No results for input(s): GLUCAP in the last 168 hours. D-Dimer No results for input(s): DDIMER in the last 72 hours. Hgb A1c No results for input(s): HGBA1C in the last 72 hours. Lipid Profile  Recent Labs  02/27/16 0255  CHOL 282*  HDL 99  LDLCALC 165*  TRIG 90  CHOLHDL 2.8   Thyroid function studies  Recent Labs  02/26/16 2335  TSH 1.180   Anemia work up No results for input(s): VITAMINB12, FOLATE, FERRITIN, TIBC, IRON, RETICCTPCT in the last 72 hours. Urinalysis    Component Value Date/Time   COLORURINE YELLOW 02/26/2016 1220   APPEARANCEUR CLEAR 02/26/2016 1220   LABSPEC 1.005 02/26/2016 1220   PHURINE 6.0 02/26/2016 1220   GLUCOSEU NEGATIVE 02/26/2016 1220   HGBUR NEGATIVE  02/26/2016 1220   HGBUR negative 03/16/2007 1506   BILIRUBINUR NEGATIVE 02/26/2016 1220   BILIRUBINUR neg 07/06/2012 1216   KETONESUR 15 (A) 02/26/2016 1220   PROTEINUR NEGATIVE 02/26/2016 1220   UROBILINOGEN negative 07/06/2012 1216   UROBILINOGEN negative 03/16/2007 1506   NITRITE NEGATIVE 02/26/2016 1220   LEUKOCYTESUR NEGATIVE 02/26/2016 1220   Sepsis Labs Invalid input(s): PROCALCITONIN,  WBC,  LACTICIDVEN Microbiology No results found for this or any previous visit (from the past 240 hour(s)).   Time coordinating discharge: 45 minutes  SIGNED:   Tawni Millers, MD  [facility] 02/27/2016, 2:37 PM Pager   If 7PM-7AM, please contact night-coverage [email or web address] Password [email or web address]

## 2016-02-27 NOTE — Evaluation (Signed)
Physical Therapy Evaluation Patient Details Name: Molly Hubbard MRN: [id number] DOB: [birth year] Today's Date: 02/27/2016   History of Present Illness  is a 66 y.o. female with history of hypertension, hyperlipidemia and anxiety presented to the ER with complaints of headache and blurred vision and slurred speech x 72 hrs.  MRI and CT negative for acute CVA.  Postivie for Atherosclerotic disease at both carotid bifurcations. No stenosis on the right. 40% stenosis of the proximal ICA on the left, with soft plaque that could be at greater risk of rupture.  Clinical Impression  Reviewed CVA risk factors, course of action with return demo understanding. Pt has already been signed off by nursing as independent on unit. Observed pt in hall walking self to ice machine. Pt reports no further visual deficits other than baseline glaucoma and cataracts. Will sign off at this time. Pt may continue to mobilize on unit independently.    Follow Up Recommendations No PT follow up    Equipment Recommendations  None recommended by PT    Recommendations for Other Services       Precautions / Restrictions Precautions Precautions: None Restrictions Weight Bearing Restrictions: No      Mobility  Bed Mobility Overal bed mobility: Independent                Transfers                    Ambulation/Gait Ambulation/Gait assistance: Independent Ambulation Distance (Feet): 150 Feet Assistive device: None Gait Pattern/deviations: WFL(Within Functional Limits)     General Gait Details: normal cadence  Stairs            Wheelchair Mobility    Modified Rankin (Stroke Patients Only) Modified Rankin (Stroke Patients Only) Pre-Morbid Rankin Score: No symptoms Modified Rankin: No symptoms     Balance                                             Pertinent Vitals/Pain Pain Assessment: No/denies pain    Home Living Family/patient expects to be discharged  to:: Private residence Living Arrangements: Spouse/significant other Available Help at Discharge: Family;Available 24 hours/day Type of Home: House                Prior Function Level of Independence: Independent         Comments: glaucoma and cataracts     Hand Dominance        Extremity/Trunk Assessment   Upper Extremity Assessment Upper Extremity Assessment: Overall WFL for tasks assessed    Lower Extremity Assessment Lower Extremity Assessment: Overall WFL for tasks assessed    Cervical / Trunk Assessment Cervical / Trunk Assessment: Normal  Communication   Communication: No difficulties  Cognition Arousal/Alertness: Awake/alert Behavior During Therapy: WFL for tasks assessed/performed Overall Cognitive Status: Within Functional Limits for tasks assessed                 General Comments: pt recalled details of recent events    General Comments General comments (skin integrity, edema, etc.): pt reports she has walked several times today in hallway, family agrees pt returned to baseline. Pt denies vision difficulties.  "I wanted to kill myself Saturday because I hurt so badly."    Exercises     Assessment/Plan    PT Assessment Patent does not need any further PT services  PT  Problem List            PT Treatment Interventions      PT Goals (Current goals can be found in the Care Plan section)  Acute Rehab PT Goals Patient Stated Goal: have tests come back fine PT Goal Formulation: With patient/family    Frequency     Barriers to discharge        Co-evaluation               End of Session   Activity Tolerance: Patient tolerated treatment well Patient left: in bed;with call bell/phone within reach;with family/visitor present Nurse Communication: Mobility status    Functional Assessment Tool Used: gait Functional Limitation: Mobility: Walking and moving around Mobility: Walking and Moving Around Current Status [phone]): 0  percent impaired, limited or restricted Mobility: Walking and Moving Around Goal Status [phone]): 0 percent impaired, limited or restricted Mobility: Walking and Moving Around Discharge Status [phone]): 0 percent impaired, limited or restricted    Time: [id number] PT Time Calculation (min) (ACUTE ONLY): 14 min   Charges:   PT Evaluation $PT Eval Low Complexity: 1 Procedure     PT G Codes:   PT G-Codes **NOT FOR INPATIENT CLASS** Functional Assessment Tool Used: gait Functional Limitation: Mobility: Walking and moving around Mobility: Walking and Moving Around Current Status [id number]): 0 percent impaired, limited or restricted Mobility: Walking and Moving Around Goal Status [id number]): 0 percent impaired, limited or restricted Mobility: Walking and Moving Around Discharge Status [phone]): 0 percent impaired, limited or restricted   Malka So, PT [phone]  Molly Hubbard 02/27/2016, 10:29 AM

## 2016-02-27 NOTE — Progress Notes (Signed)
Per Dr. Cheral Marker, patient does not need the bed alarm to be activated. Patient was educated about calling staff to help her to the bathroom.

## 2016-02-27 NOTE — Progress Notes (Signed)
Discharge instructions reviewed with patient/family. All questions answered at this time. Transport home by family.   Violett Hobbs, RN 

## 2016-02-28 LAB — HEMOGLOBIN A1C
Hgb A1c MFr Bld: 5.1 % (ref 4.8–5.6)
Mean Plasma Glucose: 100 mg/dL

## 2016-03-06 DIAGNOSIS — G43909 Migraine, unspecified, not intractable, without status migrainosus: Secondary | ICD-10-CM

## 2016-03-20 ENCOUNTER — Ambulatory Visit (INDEPENDENT_AMBULATORY_CARE_PROVIDER_SITE_OTHER): Payer: BLUE CROSS/BLUE SHIELD | Admitting: Neurology

## 2016-03-20 ENCOUNTER — Encounter: Payer: Self-pay | Admitting: Neurology

## 2016-03-20 VITALS — BP 121/78 | HR 83 | Ht 66.0 in | Wt 162.6 lb

## 2016-03-20 DIAGNOSIS — G43809 Other migraine, not intractable, without status migrainosus: Secondary | ICD-10-CM | POA: Diagnosis not present

## 2016-03-20 NOTE — Patient Instructions (Signed)
I had a long discussion with the patient regarding her episode of him. Migraine as well as mild headaches that she is having now which seem like tension headaches with analgesic rebound. I recommend she discontinue Tylenol and nabumetone well as do regular neck stretching exercises and participate in stress laxation activities like regular exercise, meditation and yoga. No further neurological testing is indicated at the present time. She may return for follow-up in the future only as necessary and no scheduled appointment was made.  Tension Headache A tension headache is a feeling of pain, pressure, or aching that is often felt over the front and sides of the head. The pain can be dull, or it can feel tight (constricting). Tension headaches are not normally associated with nausea or vomiting, and they do not get worse with physical activity. Tension headaches can last from 30 minutes to several days. This is the most common type of headache. CAUSES The exact cause of this condition is not known. Tension headaches often begin after stress, anxiety, or depression. Other triggers may include:  Alcohol.  Too much caffeine, or caffeine withdrawal.  Respiratory infections, such as colds, flu, or sinus infections.  Dental problems or teeth clenching.  Fatigue.  Holding your head and neck in the same position for a long period of time, such as while using a computer.  Smoking. SYMPTOMS Symptoms of this condition include:  A feeling of pressure around the head.  Dull, aching head pain.  Pain felt over the front and sides of the head.  Tenderness in the muscles of the head, neck, and shoulders. DIAGNOSIS This condition may be diagnosed based on your symptoms and a physical exam. Tests may be done, such as a CT scan or an MRI of your head. These tests may be done if your symptoms are severe or unusual. TREATMENT This condition may be treated with lifestyle changes and medicines to help relieve  symptoms. HOME CARE INSTRUCTIONS Managing Pain   Take over-the-counter and prescription medicines only as told by your health care provider.  Lie down in a dark, quiet room when you have a headache.  If directed, apply ice to the head and neck area:  Put ice in a plastic bag.  Place a towel between your skin and the bag.  Leave the ice on for 20 minutes, 2-3 times per day.  Use a heating pad or a hot shower to apply heat to the head and neck area as told by your health care provider. Eating and Drinking   Eat meals on a regular schedule.  Limit alcohol use.  Decrease your caffeine intake, or stop using caffeine. General Instructions   Keep all follow-up visits as told by your health care provider. This is important.  Keep a headache journal to help find out what may trigger your headaches. For example, write down:  What you eat and drink.  How much sleep you get.  Any change to your diet or medicines.  Try massage or other relaxation techniques.  Limit stress.  Sit up straight, and avoid tensing your muscles.  Do not use tobacco products, including cigarettes, chewing tobacco, or e-cigarettes. If you need help quitting, ask your health care provider.  Exercise regularly as told by your health care provider.  Get 7-9 hours of sleep, or the amount recommended by your health care provider. SEEK MEDICAL CARE IF:  Your symptoms are not helped by medicine.  You have a headache that is different from what you normally experience.  You have nausea or you vomit.  You have a fever. SEEK IMMEDIATE MEDICAL CARE IF:  Your headache becomes severe.  You have repeated vomiting.  You have a stiff neck.  You have a loss of vision.  You have problems with speech.  You have pain in your eye or ear.  You have muscular weakness or loss of muscle control.  You lose your balance or you have trouble walking.  You feel faint or you pass out.  You have  confusion. This information is not intended to replace advice given to you by your health care provider. Make sure you discuss any questions you have with your health care provider. Document Released: 01/07/2005 Document Revised: 09/28/2014 Document Reviewed: 05/02/2014 Elsevier Interactive Patient Education  2017 [employer].

## 2016-03-20 NOTE — Progress Notes (Signed)
[facility] [address] [place]. [place] [address] [phone]       OFFICE FOLLOW-UP NOTE  Ms. DORIAN CUTTS Date of Birth:  March 18, 1950 Medical Record Number:  [id number]   HPI: 7 year Caucasian lady seen today for the first office follow-up visit following hospital admission for stroke like episode in February 2018.CERIA GARING an 66 y.o.femalewho presents in transfer from OSH for further evaluation of possible TIA versus small left MCA stroke. Her symptoms began on Saturday, when she woke up with the worst headache of her life, rated as 10/10. Analgesics had little effect. Husband noted that she was having word finding difficulty and generally with "trouble getting her words out", but no associated dysarthria. She had no other neurological symptoms, denying facial droop, sensory numbness or limb weakness. She states also no neck pain, chest pain or limb pain. She had blurred vision with worsened visual acuity over her glaucoma baseline. She also states that she experienced visual illusions on Saturday consisting of "swirling together" of images and text on her computer screen "like in a fantasy land". No other illusions and denies hallucinations. She has no history of migraine headache. By Sunday her headache decreased to 5/10 and now it is at 1/10.  At OSH, CT was negative for hemorrhage. CTA was obtained to rule out aneurysm, as ED staff were still concerned about possible SAH despite negative CT. CTA did not reveal aneurysm, but did reveal 40% stenosis of the left ICA origin, per report. She was transferred to [facility] for TIA/CVA work up.  Patient was not administered IV t-PA. She was admitted for further evaluation and treatment. MRI scan of the brain did not reveal any acute stroke. CT angiogram of the head and neck revealed mild atherosclerotic disease at both carotid bifurcations with less than 40% stenosis of the left carotid with soft plaque. LDL cholesterol is  elevated at 165 mg percent. Transthoracic echo was normal. Hemoglobin A1c was 5.1. Patient was felt to have a comminuted migraine episode. She states she's done well since discharge. She has had no further episodes of vision dysfunction. She reports mild headaches which she has 2 or 3 times a week. She describes this as a pressure-like sensation on the top of her head. Does not complain nausea, vomiting, light or sound sensitivity. She has been taking Tylenol and more recently noted to nabutetasone muscle relaxant with good relief but the headaches return. She denies any significant stress. She walks 4 miles everyday. She has no other neurological complaints.   ROS:   14 system review of systems is positive for  snoring, rash, headache and all the systems negative  PMH:  Past Medical History:  Diagnosis Date  . Anxiety   . Borderline hypertension   . Breast calcifications   . Depression   . GERD (gastroesophageal reflux disease)   . Glaucoma   . Headache   . Hyperlipidemia   . Hypertension   . Migraine   . Osteopenia   . Rotator cuff syndrome     Social History:  Social History   Social History  . Marital status: Married    Spouse name: N/A  . Number of children: N/A  . Years of education: N/A   Occupational History  . Not on file.   Social History Main Topics  . Smoking status: Former Smoker    Quit date: 01/22/1988  . Smokeless tobacco: Never Used  . Alcohol use 0.6 oz/week    1 Glasses of  wine per week     Comment: daily  . Drug use: No     Comment: does not use was for medical reasons  . Sexual activity: Not on file   Other Topics Concern  . Not on file   Social History Narrative  . No narrative on file    Medications:   Current Outpatient Prescriptions on File Prior to Visit  Medication Sig Dispense Refill  . acetaminophen (TYLENOL) 500 MG tablet Take 1,000 mg by mouth every 6 (six) hours as needed for headache.    Marland Kitchen aspirin 81 MG tablet Take 81 mg by  mouth daily.     Marland Kitchen atenolol (TENORMIN) 25 MG tablet TAKE 1 TABLET BY MOUTH DAILY (Patient taking differently: TAKE 1 TABLET BY MOUTH as needed) 90 tablet 1  . escitalopram (LEXAPRO) 20 MG tablet Take 20 mg by mouth daily.      Marland Kitchen latanoprost (XALATAN) 0.005 % ophthalmic solution 1 drop at bedtime.    Marland Kitchen LORazepam (ATIVAN) 1 MG tablet Take 1 mg by mouth daily as needed for anxiety.     . Multiple Vitamin (MULTIVITAMIN WITH MINERALS) TABS tablet Take 1 tablet by mouth daily.    . niacin 100 MG tablet Take 100 mg by mouth daily with breakfast.    . omeprazole (PRILOSEC) 20 MG capsule Take 20 mg by mouth daily.       No current facility-administered medications on file prior to visit.     Allergies:   Allergies  Allergen Reactions  . Dorzolamide Other (See Comments)    Irritated her eyes    Physical Exam General: well developed, well nourished middle-age Caucasian lady, seated, in no evident distress Head: head normocephalic and atraumatic.  Neck: supple with no carotid or supraclavicular bruits Cardiovascular: regular rate and rhythm, no murmurs Musculoskeletal: no deformity Skin:  no rash/petichiae Vascular:  Normal pulses all extremities Vitals:   03/20/16 1613  BP: 121/78  Pulse: 83   Neurologic Exam Mental Status: Awake and fully alert. Oriented to place and time. Recent and remote memory intact. Attention span, concentration and fund of knowledge appropriate. Mood and affect appropriate.  Cranial Nerves: Fundoscopic exam reveals sharp disc margins. Pupils equal, briskly reactive to light. Extraocular movements full without nystagmus. Visual fields full to confrontation. Hearing intact. Facial sensation intact. Face, tongue, palate moves normally and symmetrically.  Motor: Normal bulk and tone. Normal strength in all tested extremity muscles. Sensory.: intact to touch ,pinprick .position and vibratory sensation.  Coordination: Rapid alternating movements normal in all extremities.  Finger-to-nose and heel-to-shin performed accurately bilaterally. Gait and Station: Arises from chair without difficulty. Stance is normal. Gait demonstrates normal stride length and balance . Able to heel, toe and tandem walk without difficulty.  Reflexes: 1+ and symmetric. Toes downgoing.       ASSESSMENT: 66 year old Caucasian lady with transient episode of headache with visual dysfunction likely completed migraine episode. Recurrent mild dull headaches likely tension headaches with analgesic rebound    PLAN: I had a long discussion with the patient regarding her episode of him. Migraine as well as mild headaches that she is having now which seem like tension headaches with analgesic rebound. I recommend she discontinue Tylenol and nabumetone well as do regular neck stretching exercises and participate in stress laxation activities like regular exercise, meditation and yoga. No further neurological testing is indicated at the present time. She may return for follow-up in the future only as necessary and no scheduled appointment was made. Greater than  50% of time during this 25 minute visit was spent on counseling,explanation of diagnosis, planning of further management, discussion with patient and family and coordination of care Antony Contras, MD  [facility] [address] [place],  [address]  Phone [phone] Fax [phone] Note: This document was prepared with digital dictation and possible smart phrase technology. Any transcriptional errors that result from this process are unintentional

## 2016-04-11 DIAGNOSIS — N950 Postmenopausal bleeding: Secondary | ICD-10-CM | POA: Diagnosis not present

## 2016-04-11 DIAGNOSIS — E871 Hypo-osmolality and hyponatremia: Secondary | ICD-10-CM | POA: Diagnosis not present

## 2016-04-11 DIAGNOSIS — E7800 Pure hypercholesterolemia, unspecified: Secondary | ICD-10-CM | POA: Diagnosis not present

## 2016-04-29 DIAGNOSIS — H401123 Primary open-angle glaucoma, left eye, severe stage: Secondary | ICD-10-CM | POA: Diagnosis not present

## 2016-04-29 DIAGNOSIS — H401112 Primary open-angle glaucoma, right eye, moderate stage: Secondary | ICD-10-CM | POA: Diagnosis not present

## 2016-05-08 DIAGNOSIS — H2513 Age-related nuclear cataract, bilateral: Secondary | ICD-10-CM | POA: Diagnosis not present

## 2016-05-08 DIAGNOSIS — H401112 Primary open-angle glaucoma, right eye, moderate stage: Secondary | ICD-10-CM | POA: Diagnosis not present

## 2016-05-08 DIAGNOSIS — H401123 Primary open-angle glaucoma, left eye, severe stage: Secondary | ICD-10-CM | POA: Diagnosis not present

## 2016-06-03 DIAGNOSIS — Z0001 Encounter for general adult medical examination with abnormal findings: Secondary | ICD-10-CM | POA: Diagnosis not present

## 2016-06-03 DIAGNOSIS — Z87891 Personal history of nicotine dependence: Secondary | ICD-10-CM | POA: Diagnosis not present

## 2016-06-03 DIAGNOSIS — E7800 Pure hypercholesterolemia, unspecified: Secondary | ICD-10-CM | POA: Diagnosis not present

## 2016-06-03 DIAGNOSIS — H401123 Primary open-angle glaucoma, left eye, severe stage: Secondary | ICD-10-CM | POA: Diagnosis not present

## 2016-06-03 DIAGNOSIS — E871 Hypo-osmolality and hyponatremia: Secondary | ICD-10-CM | POA: Diagnosis not present

## 2016-06-03 DIAGNOSIS — Z23 Encounter for immunization: Secondary | ICD-10-CM | POA: Diagnosis not present

## 2016-06-03 DIAGNOSIS — E559 Vitamin D deficiency, unspecified: Secondary | ICD-10-CM | POA: Diagnosis not present

## 2016-06-03 DIAGNOSIS — H401112 Primary open-angle glaucoma, right eye, moderate stage: Secondary | ICD-10-CM | POA: Diagnosis not present

## 2016-06-03 DIAGNOSIS — H2513 Age-related nuclear cataract, bilateral: Secondary | ICD-10-CM | POA: Diagnosis not present

## 2016-06-04 ENCOUNTER — Other Ambulatory Visit: Payer: Self-pay | Admitting: Internal Medicine

## 2016-06-04 DIAGNOSIS — Z87891 Personal history of nicotine dependence: Secondary | ICD-10-CM

## 2016-06-24 DIAGNOSIS — H2513 Age-related nuclear cataract, bilateral: Secondary | ICD-10-CM | POA: Diagnosis not present

## 2016-06-24 DIAGNOSIS — H2511 Age-related nuclear cataract, right eye: Secondary | ICD-10-CM | POA: Diagnosis not present

## 2016-07-04 DIAGNOSIS — H25811 Combined forms of age-related cataract, right eye: Secondary | ICD-10-CM | POA: Diagnosis not present

## 2016-07-04 DIAGNOSIS — Z8673 Personal history of transient ischemic attack (TIA), and cerebral infarction without residual deficits: Secondary | ICD-10-CM | POA: Diagnosis not present

## 2016-07-04 DIAGNOSIS — H25813 Combined forms of age-related cataract, bilateral: Secondary | ICD-10-CM | POA: Diagnosis not present

## 2016-07-04 DIAGNOSIS — K219 Gastro-esophageal reflux disease without esophagitis: Secondary | ICD-10-CM | POA: Diagnosis not present

## 2016-07-10 DIAGNOSIS — J309 Allergic rhinitis, unspecified: Secondary | ICD-10-CM | POA: Diagnosis not present

## 2016-07-10 DIAGNOSIS — J019 Acute sinusitis, unspecified: Secondary | ICD-10-CM | POA: Diagnosis not present

## 2016-07-10 DIAGNOSIS — J029 Acute pharyngitis, unspecified: Secondary | ICD-10-CM | POA: Diagnosis not present

## 2016-07-25 DIAGNOSIS — H25812 Combined forms of age-related cataract, left eye: Secondary | ICD-10-CM | POA: Diagnosis not present

## 2016-07-25 DIAGNOSIS — I10 Essential (primary) hypertension: Secondary | ICD-10-CM | POA: Diagnosis not present

## 2016-07-25 DIAGNOSIS — Z8673 Personal history of transient ischemic attack (TIA), and cerebral infarction without residual deficits: Secondary | ICD-10-CM | POA: Diagnosis not present

## 2016-07-25 DIAGNOSIS — K219 Gastro-esophageal reflux disease without esophagitis: Secondary | ICD-10-CM | POA: Diagnosis not present

## 2016-09-02 DIAGNOSIS — H401123 Primary open-angle glaucoma, left eye, severe stage: Secondary | ICD-10-CM | POA: Diagnosis not present

## 2016-09-02 DIAGNOSIS — H401112 Primary open-angle glaucoma, right eye, moderate stage: Secondary | ICD-10-CM | POA: Diagnosis not present

## 2016-09-27 DIAGNOSIS — L820 Inflamed seborrheic keratosis: Secondary | ICD-10-CM | POA: Diagnosis not present

## 2016-10-07 DIAGNOSIS — S01112A Laceration without foreign body of left eyelid and periocular area, initial encounter: Secondary | ICD-10-CM | POA: Diagnosis not present

## 2016-10-07 DIAGNOSIS — S0181XA Laceration without foreign body of other part of head, initial encounter: Secondary | ICD-10-CM | POA: Diagnosis not present

## 2016-10-18 DIAGNOSIS — D485 Neoplasm of uncertain behavior of skin: Secondary | ICD-10-CM | POA: Diagnosis not present

## 2016-10-18 DIAGNOSIS — L820 Inflamed seborrheic keratosis: Secondary | ICD-10-CM | POA: Diagnosis not present

## 2016-11-13 DIAGNOSIS — Z23 Encounter for immunization: Secondary | ICD-10-CM | POA: Diagnosis not present

## 2017-02-10 DIAGNOSIS — Z87891 Personal history of nicotine dependence: Secondary | ICD-10-CM | POA: Diagnosis not present

## 2017-02-10 DIAGNOSIS — I10 Essential (primary) hypertension: Secondary | ICD-10-CM | POA: Diagnosis not present

## 2017-02-10 DIAGNOSIS — Z1211 Encounter for screening for malignant neoplasm of colon: Secondary | ICD-10-CM | POA: Diagnosis not present

## 2017-02-17 DIAGNOSIS — Z1289 Encounter for screening for malignant neoplasm of other sites: Secondary | ICD-10-CM | POA: Diagnosis not present

## 2017-02-17 DIAGNOSIS — Z1231 Encounter for screening mammogram for malignant neoplasm of breast: Secondary | ICD-10-CM | POA: Diagnosis not present

## 2017-03-28 DIAGNOSIS — Z961 Presence of intraocular lens: Secondary | ICD-10-CM | POA: Diagnosis not present

## 2017-03-28 DIAGNOSIS — H26493 Other secondary cataract, bilateral: Secondary | ICD-10-CM | POA: Diagnosis not present

## 2017-03-28 DIAGNOSIS — Z9841 Cataract extraction status, right eye: Secondary | ICD-10-CM | POA: Diagnosis not present

## 2017-03-28 DIAGNOSIS — Z9842 Cataract extraction status, left eye: Secondary | ICD-10-CM | POA: Diagnosis not present

## 2017-03-28 DIAGNOSIS — H401123 Primary open-angle glaucoma, left eye, severe stage: Secondary | ICD-10-CM | POA: Diagnosis not present

## 2017-03-28 DIAGNOSIS — H401112 Primary open-angle glaucoma, right eye, moderate stage: Secondary | ICD-10-CM | POA: Diagnosis not present

## 2017-06-23 DIAGNOSIS — H401123 Primary open-angle glaucoma, left eye, severe stage: Secondary | ICD-10-CM | POA: Diagnosis not present

## 2017-06-23 DIAGNOSIS — H401112 Primary open-angle glaucoma, right eye, moderate stage: Secondary | ICD-10-CM | POA: Diagnosis not present

## 2017-06-30 ENCOUNTER — Other Ambulatory Visit: Payer: Self-pay | Admitting: Internal Medicine

## 2017-06-30 DIAGNOSIS — I10 Essential (primary) hypertension: Secondary | ICD-10-CM | POA: Diagnosis not present

## 2017-06-30 DIAGNOSIS — Z6824 Body mass index (BMI) 24.0-24.9, adult: Secondary | ICD-10-CM | POA: Diagnosis not present

## 2017-06-30 DIAGNOSIS — Z87891 Personal history of nicotine dependence: Secondary | ICD-10-CM

## 2017-06-30 DIAGNOSIS — R7989 Other specified abnormal findings of blood chemistry: Secondary | ICD-10-CM | POA: Diagnosis not present

## 2017-06-30 DIAGNOSIS — R634 Abnormal weight loss: Secondary | ICD-10-CM | POA: Diagnosis not present

## 2017-06-30 DIAGNOSIS — E559 Vitamin D deficiency, unspecified: Secondary | ICD-10-CM | POA: Diagnosis not present

## 2017-06-30 DIAGNOSIS — E7800 Pure hypercholesterolemia, unspecified: Secondary | ICD-10-CM | POA: Diagnosis not present

## 2017-07-14 ENCOUNTER — Ambulatory Visit
Admission: RE | Admit: 2017-07-14 | Discharge: 2017-07-14 | Disposition: A | Discharge status: Home / Self Care | Payer: Medicare Other | Source: Ambulatory Visit | Attending: Internal Medicine | Admitting: Internal Medicine

## 2017-07-14 DIAGNOSIS — Z87891 Personal history of nicotine dependence: Secondary | ICD-10-CM

## 2017-07-17 ENCOUNTER — Other Ambulatory Visit: Payer: Self-pay | Admitting: Internal Medicine

## 2017-07-17 DIAGNOSIS — Z87891 Personal history of nicotine dependence: Secondary | ICD-10-CM | POA: Diagnosis not present

## 2017-07-17 DIAGNOSIS — R945 Abnormal results of liver function studies: Secondary | ICD-10-CM | POA: Diagnosis not present

## 2017-07-17 DIAGNOSIS — R7989 Other specified abnormal findings of blood chemistry: Secondary | ICD-10-CM

## 2017-07-17 DIAGNOSIS — R911 Solitary pulmonary nodule: Secondary | ICD-10-CM | POA: Diagnosis not present

## 2017-07-17 DIAGNOSIS — Z789 Other specified health status: Secondary | ICD-10-CM | POA: Diagnosis not present

## 2017-07-29 ENCOUNTER — Ambulatory Visit
Admission: RE | Admit: 2017-07-29 | Discharge: 2017-07-29 | Disposition: A | Discharge status: Home / Self Care | Payer: Medicare Other | Source: Ambulatory Visit | Attending: Internal Medicine | Admitting: Internal Medicine

## 2017-07-29 DIAGNOSIS — R7989 Other specified abnormal findings of blood chemistry: Secondary | ICD-10-CM | POA: Diagnosis not present

## 2017-07-29 DIAGNOSIS — R945 Abnormal results of liver function studies: Principal | ICD-10-CM

## 2017-08-19 DIAGNOSIS — R911 Solitary pulmonary nodule: Secondary | ICD-10-CM | POA: Diagnosis not present

## 2017-08-19 DIAGNOSIS — R945 Abnormal results of liver function studies: Secondary | ICD-10-CM | POA: Diagnosis not present

## 2017-08-19 DIAGNOSIS — Z789 Other specified health status: Secondary | ICD-10-CM | POA: Diagnosis not present

## 2017-09-11 DIAGNOSIS — Z0000 Encounter for general adult medical examination without abnormal findings: Secondary | ICD-10-CM | POA: Diagnosis not present

## 2017-09-11 DIAGNOSIS — E7800 Pure hypercholesterolemia, unspecified: Secondary | ICD-10-CM | POA: Diagnosis not present

## 2017-09-11 DIAGNOSIS — Z789 Other specified health status: Secondary | ICD-10-CM | POA: Diagnosis not present

## 2017-09-11 DIAGNOSIS — Z87891 Personal history of nicotine dependence: Secondary | ICD-10-CM | POA: Diagnosis not present

## 2017-09-11 DIAGNOSIS — I10 Essential (primary) hypertension: Secondary | ICD-10-CM | POA: Diagnosis not present

## 2017-09-11 DIAGNOSIS — E559 Vitamin D deficiency, unspecified: Secondary | ICD-10-CM | POA: Diagnosis not present

## 2017-09-11 DIAGNOSIS — R634 Abnormal weight loss: Secondary | ICD-10-CM | POA: Diagnosis not present

## 2017-10-01 DIAGNOSIS — J029 Acute pharyngitis, unspecified: Secondary | ICD-10-CM | POA: Diagnosis not present

## 2017-10-06 DIAGNOSIS — J019 Acute sinusitis, unspecified: Secondary | ICD-10-CM | POA: Diagnosis not present

## 2017-10-23 DIAGNOSIS — Z23 Encounter for immunization: Secondary | ICD-10-CM | POA: Diagnosis not present

## 2017-11-12 ENCOUNTER — Other Ambulatory Visit: Payer: Self-pay | Admitting: Nurse Practitioner

## 2017-11-12 DIAGNOSIS — N6310 Unspecified lump in the right breast, unspecified quadrant: Secondary | ICD-10-CM | POA: Diagnosis not present

## 2017-11-13 ENCOUNTER — Ambulatory Visit
Admission: RE | Admit: 2017-11-13 | Discharge: 2017-11-13 | Disposition: A | Discharge status: Home / Self Care | Payer: Medicare Other | Source: Ambulatory Visit | Attending: Nurse Practitioner | Admitting: Nurse Practitioner

## 2017-11-13 ENCOUNTER — Other Ambulatory Visit: Payer: Self-pay | Admitting: Nurse Practitioner

## 2017-11-13 DIAGNOSIS — N6310 Unspecified lump in the right breast, unspecified quadrant: Secondary | ICD-10-CM

## 2017-12-03 DIAGNOSIS — L570 Actinic keratosis: Secondary | ICD-10-CM | POA: Diagnosis not present

## 2017-12-03 DIAGNOSIS — B078 Other viral warts: Secondary | ICD-10-CM | POA: Diagnosis not present

## 2017-12-03 DIAGNOSIS — L821 Other seborrheic keratosis: Secondary | ICD-10-CM | POA: Diagnosis not present

## 2018-02-04 DIAGNOSIS — H401123 Primary open-angle glaucoma, left eye, severe stage: Secondary | ICD-10-CM | POA: Diagnosis not present

## 2018-02-04 DIAGNOSIS — H401112 Primary open-angle glaucoma, right eye, moderate stage: Secondary | ICD-10-CM | POA: Diagnosis not present

## 2018-02-26 DIAGNOSIS — L570 Actinic keratosis: Secondary | ICD-10-CM | POA: Diagnosis not present

## 2018-02-26 DIAGNOSIS — D485 Neoplasm of uncertain behavior of skin: Secondary | ICD-10-CM | POA: Diagnosis not present

## 2018-02-26 DIAGNOSIS — D0461 Carcinoma in situ of skin of right upper limb, including shoulder: Secondary | ICD-10-CM | POA: Diagnosis not present

## 2018-02-26 DIAGNOSIS — L821 Other seborrheic keratosis: Secondary | ICD-10-CM | POA: Diagnosis not present

## 2018-02-26 DIAGNOSIS — L820 Inflamed seborrheic keratosis: Secondary | ICD-10-CM | POA: Diagnosis not present

## 2018-02-26 DIAGNOSIS — D225 Melanocytic nevi of trunk: Secondary | ICD-10-CM | POA: Diagnosis not present

## 2018-03-06 DIAGNOSIS — D0461 Carcinoma in situ of skin of right upper limb, including shoulder: Secondary | ICD-10-CM | POA: Diagnosis not present

## 2018-03-20 DIAGNOSIS — H26493 Other secondary cataract, bilateral: Secondary | ICD-10-CM | POA: Diagnosis not present

## 2018-07-01 DIAGNOSIS — Z419 Encounter for procedure for purposes other than remedying health state, unspecified: Secondary | ICD-10-CM | POA: Diagnosis not present

## 2018-07-01 DIAGNOSIS — D485 Neoplasm of uncertain behavior of skin: Secondary | ICD-10-CM | POA: Diagnosis not present

## 2018-07-01 DIAGNOSIS — B079 Viral wart, unspecified: Secondary | ICD-10-CM | POA: Diagnosis not present

## 2018-10-08 DIAGNOSIS — Z23 Encounter for immunization: Secondary | ICD-10-CM | POA: Diagnosis not present

## 2018-10-16 DIAGNOSIS — H26492 Other secondary cataract, left eye: Secondary | ICD-10-CM | POA: Diagnosis not present

## 2018-11-18 DIAGNOSIS — Z419 Encounter for procedure for purposes other than remedying health state, unspecified: Secondary | ICD-10-CM | POA: Diagnosis not present

## 2018-11-18 DIAGNOSIS — B078 Other viral warts: Secondary | ICD-10-CM | POA: Diagnosis not present

## 2018-12-21 DIAGNOSIS — H401112 Primary open-angle glaucoma, right eye, moderate stage: Secondary | ICD-10-CM | POA: Diagnosis not present

## 2018-12-21 DIAGNOSIS — H401123 Primary open-angle glaucoma, left eye, severe stage: Secondary | ICD-10-CM | POA: Diagnosis not present

## 2019-02-09 ENCOUNTER — Ambulatory Visit: Payer: Medicare Other | Attending: Internal Medicine

## 2019-02-09 DIAGNOSIS — Z23 Encounter for immunization: Secondary | ICD-10-CM

## 2019-02-09 NOTE — Progress Notes (Signed)
   Covid-19 Vaccination Clinic  Name:  Molly Hubbard    MRN: [id number] DOB: May 13, 1950  02/09/2019  Molly Hubbard was observed post Covid-19 immunization for 15 minutes without incidence. She was provided with Vaccine Information Sheet and instruction to access the V-Safe system.   Molly Hubbard was instructed to call 911 with any severe reactions post vaccine: Marland Kitchen Difficulty breathing  . Swelling of your face and throat  . A fast heartbeat  . A bad rash all over your body  . Dizziness and weakness    Immunizations Administered    Name Date Dose VIS Date Route   Pfizer COVID-19 Vaccine 02/09/2019  6:01 PM 0.3 mL 01/01/2019 Intramuscular   Manufacturer: [employer], [employer]   Lot: [id number]   [place]: [id number]

## 2019-03-01 ENCOUNTER — Ambulatory Visit: Payer: Medicare Other | Attending: Internal Medicine

## 2019-03-01 DIAGNOSIS — Z23 Encounter for immunization: Secondary | ICD-10-CM

## 2019-03-01 NOTE — Progress Notes (Signed)
   Covid-19 Vaccination Clinic  Name:  Molly Hubbard    MRN: [id number] DOB: May 20, 1950  03/01/2019  [name] was observed post Covid-19 immunization for 15 minutes without incidence. She was provided with Vaccine Information Sheet and instruction to access the V-Safe system.   [name] was instructed to call 911 with any severe reactions post vaccine: Marland Kitchen Difficulty breathing  . Swelling of your face and throat  . A fast heartbeat  . A bad rash all over your body  . Dizziness and weakness    Immunizations Administered    Name Date Dose VIS Date Route   Pfizer COVID-19 Vaccine 03/01/2019  9:33 AM 0.3 mL 01/01/2019 Intramuscular   Manufacturer: [place]   Lot: [id number]   [place]: [id number]

## 2019-11-20 ENCOUNTER — Ambulatory Visit: Payer: Medicare Other | Attending: Internal Medicine

## 2019-11-20 DIAGNOSIS — Z23 Encounter for immunization: Secondary | ICD-10-CM

## 2019-11-20 NOTE — Progress Notes (Signed)
   Covid-19 Vaccination Clinic  Name:  AIRYANA SPRUNGER    MRN: [id number] DOB: 1950-12-19  11/20/2019  Ms. Kellenberger was observed post Covid-19 immunization for 15 minutes without incident. She was provided with Vaccine Information Sheet and instruction to access the V-Safe system.   Ms. Correll was instructed to call 911 with any severe reactions post vaccine: Marland Kitchen Difficulty breathing  . Swelling of face and throat  . A fast heartbeat  . A bad rash all over body  . Dizziness and weakness

## 2020-03-01 IMAGING — CT CT CHEST LUNG CANCER SCREENING LOW DOSE W/O CM
2 of 5 series · 15 of 40 positions shown, 18 images · non-contrast
Comparison: Plain film 04/05/2013.  No prior CT.

CLINICAL DATA: Fifty-three pack-year smoking history, quit 14 years
ago.

EXAM:
CT CHEST WITHOUT CONTRAST LOW-DOSE FOR LUNG CANCER SCREENING
TECHNIQUE: Multidetector CT imaging of the chest was performed following the
standard protocol without IV contrast.

[Series 4: lung 1.00 br44 cor · coronal · 0.62mm/px · 3 of 317 slices shown]
[im 64/317  lung]
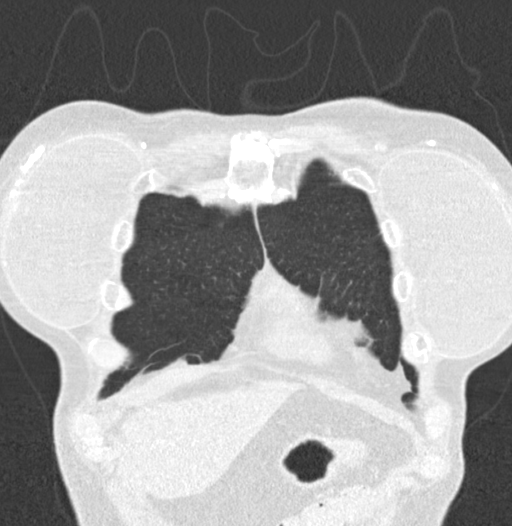
[im 127/317  lung]
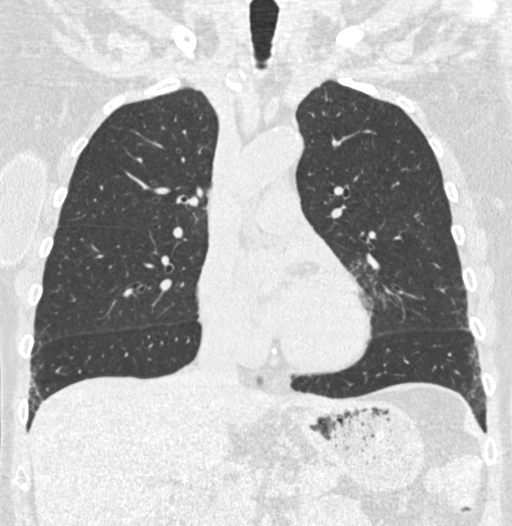
[im 190/317  lung]
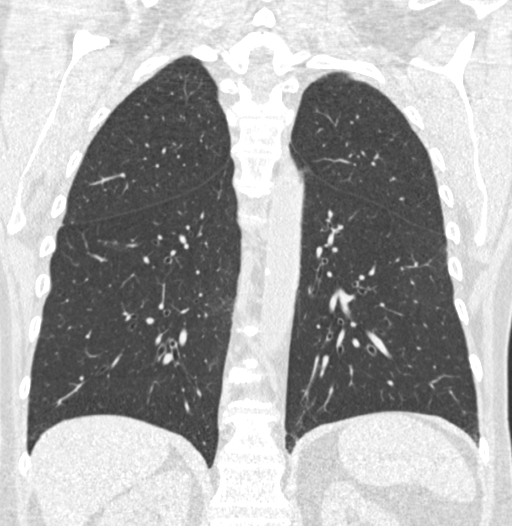

[Series 9: lung 1.00 br60 · axial · 0.65mm/px · z∈[-964,-668]mm · 12 of 326 slices shown, 15 images]
[im 15/326  mediastinal]
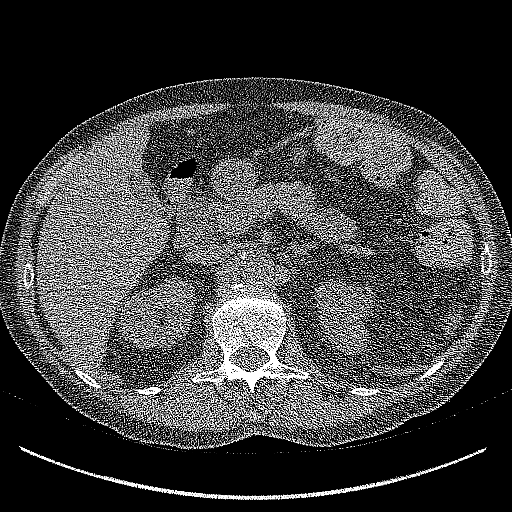
[im 15/326  lung]
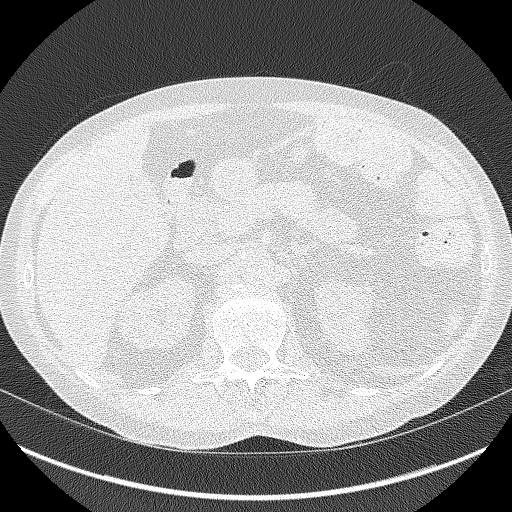
[im 45/326  lung]
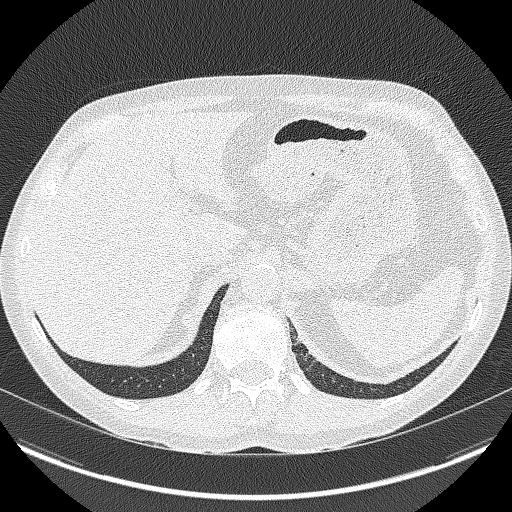
[im 74/326  lung]
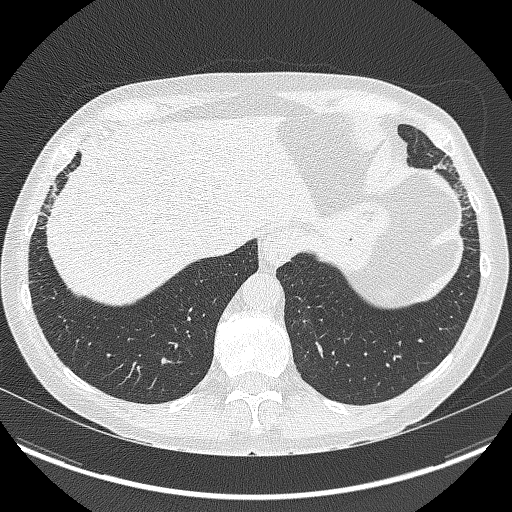
[im 104/326  lung]
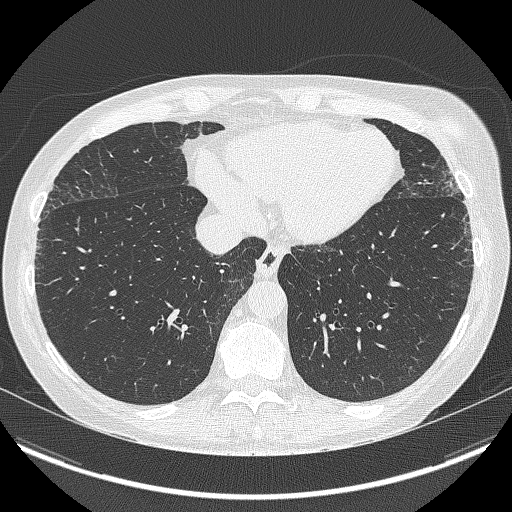
[im 119/326  mediastinal]
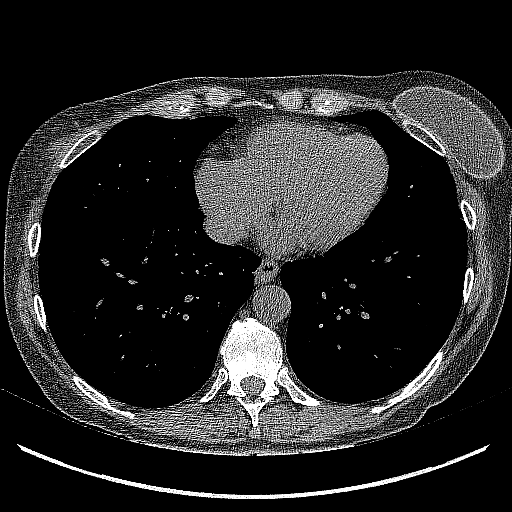
[im 119/326  lung]
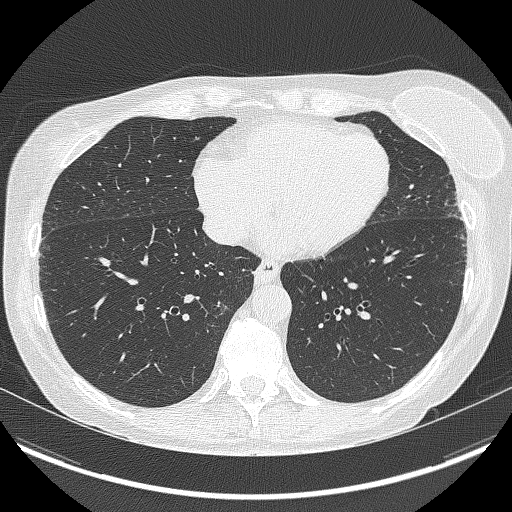
[im 148/326  lung]
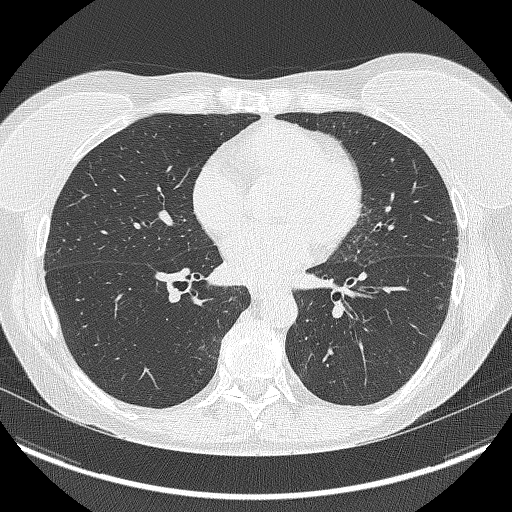
[im 178/326  lung]
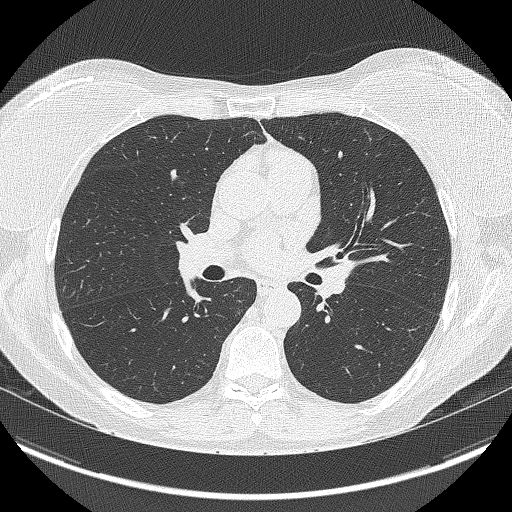
[im 207/326  lung]
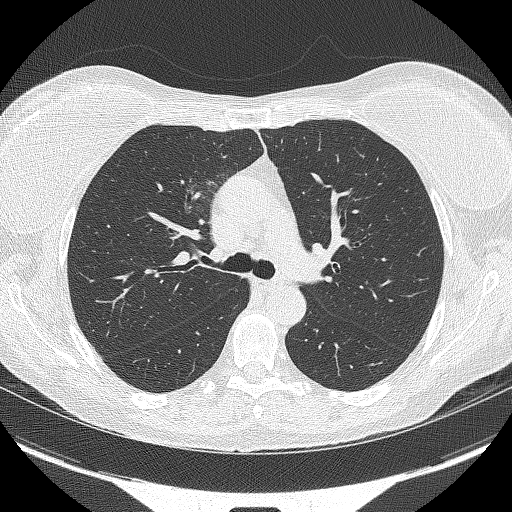
[im 222/326  mediastinal]
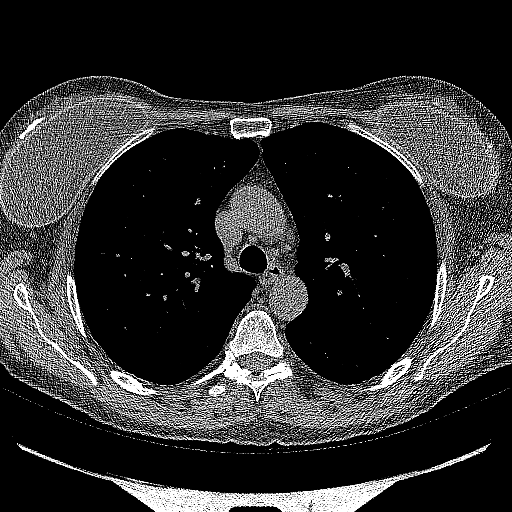
[im 222/326  lung]
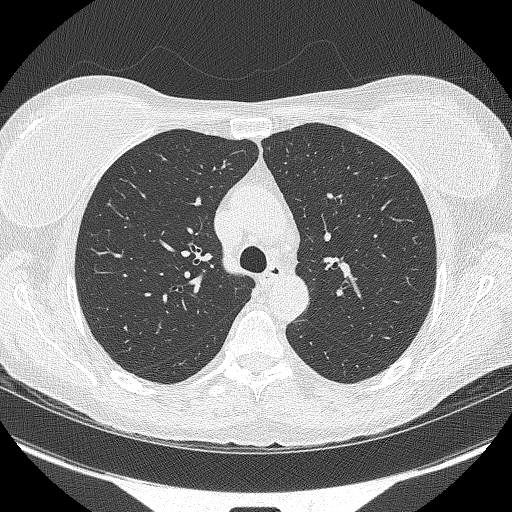
[im 252/326  lung]
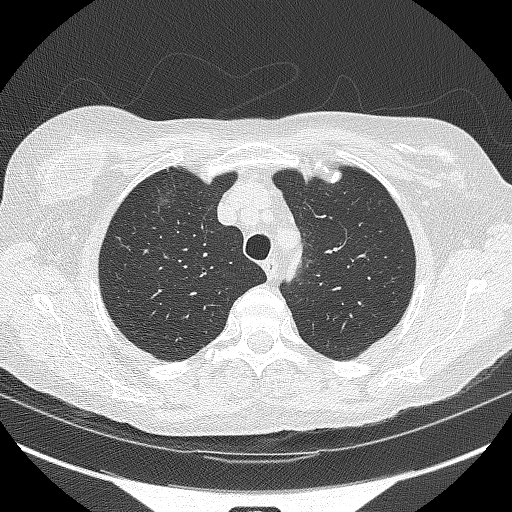
[im 281/326  lung]
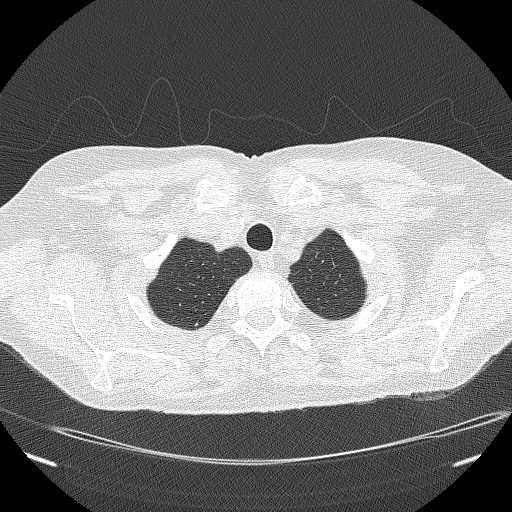
[im 311/326  lung]
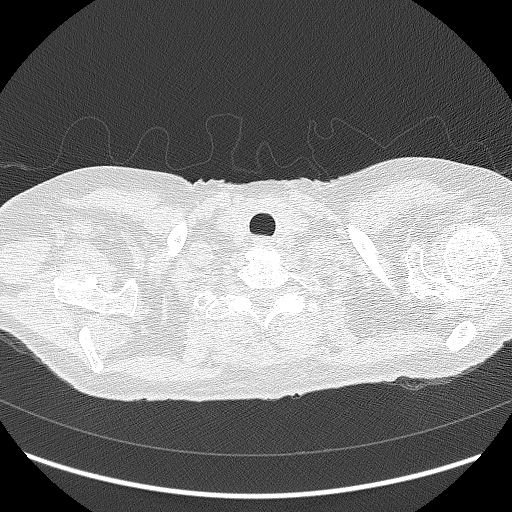

[15 of 40 positions shown; findings below may reference images not displayed]

FINDINGS: Cardiovascular: Aortic and branch vessel atherosclerosis. Normal
heart size, without pericardial effusion. Lad and right coronary
artery atherosclerosis.

Mediastinum/Nodes: No mediastinal or definite hilar adenopathy,
given limitations of unenhanced CT. Tiny hiatal hernia.

Lungs/Pleura: No pleural fluid. Nonspecific mild interstitial
thickening at the lung bases anteriorly. Isolated subpleural
pulmonary nodules x2. The larger is in the left upper lobe at volume
derived equivalent diameter 2.5 mm.

Upper Abdomen: Subcentimeter well-circumscribed left hepatic lobe
low-density liver lesion is likely a cyst. Normal imaged portions of
the spleen, stomach, pancreas, gallbladder, adrenal glands, kidneys.
Abdominal aortic atherosclerosis.

Musculoskeletal: Bilateral breast implants. Moderate thoracic
spondylosis.
IMPRESSION: 1. Lung-RADS 2, benign appearance or behavior. Continue annual
screening with low-dose chest CT without contrast in 12 months.
2. Age advanced coronary artery atherosclerosis. Recommend
assessment of coronary risk factors and consideration of medical
therapy.
3.  Aortic Atherosclerosis ([id number]).

## 2020-07-24 ENCOUNTER — Emergency Department (HOSPITAL_COMMUNITY)
Admission: EM | Admit: 2020-07-24 | Discharge: 2020-07-24 | Disposition: A | Discharge status: Home / Self Care | Payer: Medicare Other | Attending: Emergency Medicine | Admitting: Emergency Medicine

## 2020-07-24 DIAGNOSIS — W010XXA Fall on same level from slipping, tripping and stumbling without subsequent striking against object, initial encounter: Secondary | ICD-10-CM | POA: Insufficient documentation

## 2020-07-24 DIAGNOSIS — T7840XA Allergy, unspecified, initial encounter: Secondary | ICD-10-CM | POA: Diagnosis present

## 2020-07-24 DIAGNOSIS — Y9301 Activity, walking, marching and hiking: Secondary | ICD-10-CM | POA: Diagnosis not present

## 2020-07-24 DIAGNOSIS — Z87891 Personal history of nicotine dependence: Secondary | ICD-10-CM | POA: Diagnosis not present

## 2020-07-24 DIAGNOSIS — I10 Essential (primary) hypertension: Secondary | ICD-10-CM | POA: Insufficient documentation

## 2020-07-24 DIAGNOSIS — T782XXA Anaphylactic shock, unspecified, initial encounter: Secondary | ICD-10-CM | POA: Insufficient documentation

## 2020-07-24 DIAGNOSIS — Y92009 Unspecified place in unspecified non-institutional (private) residence as the place of occurrence of the external cause: Secondary | ICD-10-CM | POA: Diagnosis not present

## 2020-07-24 DIAGNOSIS — T63461A Toxic effect of venom of wasps, accidental (unintentional), initial encounter: Secondary | ICD-10-CM | POA: Diagnosis not present

## 2020-07-24 DIAGNOSIS — Z7982 Long term (current) use of aspirin: Secondary | ICD-10-CM | POA: Insufficient documentation

## 2020-07-24 DIAGNOSIS — Z79899 Other long term (current) drug therapy: Secondary | ICD-10-CM | POA: Diagnosis not present

## 2020-07-24 LAB — BASIC METABOLIC PANEL
Anion gap: 9 (ref 5–15)
BUN: 27 mg/dL — ABNORMAL HIGH (ref 8–23)
CO2: 23 mmol/L (ref 22–32)
Calcium: 8.8 mg/dL — ABNORMAL LOW (ref 8.9–10.3)
Chloride: 107 mmol/L (ref 98–111)
Creatinine, Ser: 0.74 mg/dL (ref 0.44–1.00)
GFR, Estimated: >60 mL/min (ref >60)
Glucose, Bld: 149 mg/dL — ABNORMAL HIGH (ref 70–99)
Potassium: 3.2 mmol/L — ABNORMAL LOW (ref 3.5–5.1)
Sodium: 139 mmol/L (ref 135–145)

## 2020-07-24 LAB — CBC WITH DIFFERENTIAL/PLATELET
Abs Immature Granulocytes: 0.13 10*3/uL — ABNORMAL HIGH (ref 0.00–0.07)
Basophils Absolute: 0 10*3/uL (ref 0.0–0.1)
Basophils Relative: 0 %
Eosinophils Absolute: 0 10*3/uL (ref 0.0–0.5)
Eosinophils Relative: 0 %
HCT: 37.5 % (ref 36.0–46.0)
Hemoglobin: 12.4 g/dL (ref 12.0–15.0)
Immature Granulocytes: 1 %
Lymphocytes Relative: 5 %
Lymphs Abs: 0.9 10*3/uL (ref 0.7–4.0)
MCH: 31.9 pg (ref 26.0–34.0)
MCHC: 33.1 g/dL (ref 30.0–36.0)
MCV: 96.4 fL (ref 80.0–100.0)
Monocytes Absolute: 0.9 10*3/uL (ref 0.1–1.0)
Monocytes Relative: 5 %
Neutro Abs: 16.7 10*3/uL — ABNORMAL HIGH (ref 1.7–7.7)
Neutrophils Relative %: 89 %
Platelets: 246 10*3/uL (ref 150–400)
RBC: 3.89 MIL/uL (ref 3.87–5.11)
RDW: 12.8 % (ref 11.5–15.5)
WBC: 18.7 10*3/uL — ABNORMAL HIGH (ref 4.0–10.5)
nRBC: 0 % (ref 0.0–0.2)

## 2020-07-24 LAB — TROPONIN I (HIGH SENSITIVITY): Troponin I (High Sensitivity): 7 ng/L (ref <18)

## 2020-07-24 MED ORDER — EPINEPHRINE 0.3 MG/0.3ML IJ SOAJ: 0.3000 mg | INTRAMUSCULAR | 0 refills | Status: AC | PRN

## 2020-07-24 MED ORDER — FAMOTIDINE 20 MG PO TABS: 20.0000 mg | ORAL_TABLET | Freq: Every day | ORAL | 0 refills | Status: AC

## 2020-07-24 MED ORDER — DIPHENHYDRAMINE HCL 25 MG PO TABS: 25.0000 mg | ORAL_TABLET | Freq: Four times a day (QID) | ORAL | 0 refills | Status: AC | PRN

## 2020-07-24 MED ORDER — PREDNISONE 10 MG PO TABS: 20.0000 mg | ORAL_TABLET | Freq: Every day | ORAL | 0 refills | Status: AC

## 2020-07-24 MED ORDER — POTASSIUM CHLORIDE CRYS ER 20 MEQ PO TBCR
40.0000 meq | EXTENDED_RELEASE_TABLET | Freq: Once | ORAL | Status: AC
  Administered 2020-07-24: 40 meq via ORAL
  Filled 2020-07-24: qty 2

## 2020-07-24 NOTE — ED Provider Notes (Signed)
[facility] EMERGENCY DEPARTMENT Provider Note   CSN: [id number] Arrival date & time: 07/24/20  1552     History Chief Complaint  Patient presents with   Allergic Reaction   Insect Bite    Molly Hubbard is a 70 y.o. female with past medical history significant for hypertension, depression, GERD, headache, glaucoma, osteopenia, anxiety.   HPI Patient presents to emergency department today via EMS with chief complaint of allergic reaction insect bite.  Patient states around two thirds afternoon she was stung by a yellow jacket on her left upper back and left knee.  She was out walking on a trail when this happened.  She states she immediately felt lightheaded and knew she had to get back home.  She was able to ambulate back to her house.  She states she walked inside and told her husband that she was not feeling well and that she needed help and passed out.  She fell landing on her backside.  Husband does not think she hit her head.  She was unconscious for approximately 30 to 45 seconds.  While unconscious she was incontinent of stool.  There was no body shaking or seizure-like activity.  Husband called EMS.  On their arrival BP was 72/38, she was given 1500 mL fluid bolus and blood pressure was then normotensive.  She was also given IM epi, Zofran, Benadryl and Solu-Medrol.  Patient states she felt like it was hard to breathe and like she had a headache while in the ambulance.  She is unsure if this was because of her anxiety or from the allergic reaction.  He has history of headaches and stated felt similar.  She was put on nonrebreather by EMS and breathing improved.  She states she might of had chest pain but does not exactly recall.  Patient states she has been stung by yellow jackets in the past and had localized reactions.  Never has had anaphylaxis.  Denies any recent illness.  Patient denies any drug or alcohol use.  She states she feels normal now, just a little  anxious.    Past Medical History:  Diagnosis Date   Anxiety    Borderline hypertension    Breast calcifications    Depression    GERD (gastroesophageal reflux disease)    Glaucoma    Headache    Hyperlipidemia    Hypertension    Migraine    Osteopenia    Rotator cuff syndrome     Patient Active Problem List   Diagnosis Date Noted   Migraine without status migrainosus, not intractable    Slurred speech 02/26/2016   Headache 02/26/2016   TIA (transient ischemic attack) 02/26/2016   Hyponatremia 02/26/2016   HTN (hypertension) 01/24/2014   Breast calcification, right  DX mm 2015 05/18/2013   Alcohol dependence ([employer]) 07/28/2012   Marijuana use 07/28/2012   Elevated LFTs 05/21/2012   Glaucoma 03/16/2012   Menopause 03/16/2012   Family history of melanoma 03/16/2012   Family history of MI (myocardial infarction) 03/16/2012   HYPERLIPIDEMIA 12/14/2008   DEPRESSION 12/14/2008   ROTATOR CUFF SYNDROME, LEFT 12/14/2008   FASTING HYPERGLYCEMIA 12/14/2008   DYSPEPSIA 07/03/2007   ANXIETY STATE, UNSPECIFIED 06/04/2007   OSTEOPENIA 06/04/2007    Past Surgical History:  Procedure Laterality Date   BREAST SURGERY     CESAREAN SECTION     CLAVICLE SURGERY     @ 19   COLONOSCOPY  2006   COLONOSCOPY  2001   Done for  positive stool cards    CYSTOSCOPY  2007   Dr.MacDiarmid   FACIAL COSMETIC SURGERY     face lift   NOSE SURGERY     Fractured from baseball injury    RHINOPLASTY     TONSILLECTOMY     TUBAL LIGATION       OB History     Gravida  3   Para      Term      Preterm      AB  1   Living  2      SAB  1   IAB      Ectopic      Multiple      Live Births              Family History  Problem Relation Age of Onset   Melanoma Sister    Cancer Sister        melanoma   Stroke Mother 50   Heart attack Father 38    Social History   Tobacco Use   Smoking status: Former    Pack years: 0.00    Types: Cigarettes    Quit date: 01/22/1988     Years since quitting: 32.5   Smokeless tobacco: Never  Substance Use Topics   Alcohol use: Yes    Alcohol/week: 1.0 standard drink    Types: 1 Glasses of wine per week    Comment: daily   Drug use: No    Types: Marijuana    Comment: does not use was for medical reasons    Home Medications Prior to Admission medications   Medication Sig Start Date End Date Taking? Authorizing Provider  diphenhydrAMINE (BENADRYL) 25 MG tablet Take 1 tablet (25 mg total) by mouth every 6 (six) hours as needed. 07/24/20  Yes Walisiewicz, Lyllie Cobbins E, PA-C  EPINEPHrine 0.3 mg/0.3 mL IJ SOAJ injection Inject 0.3 mg into the muscle as needed for anaphylaxis. 07/24/20  Yes Walisiewicz, Judy Pollman E, PA-C  famotidine (PEPCID) 20 MG tablet Take 1 tablet (20 mg total) by mouth daily for 5 days. 07/24/20 07/29/20 Yes Walisiewicz, Manessa Buley E, PA-C  predniSONE (DELTASONE) 10 MG tablet Take 2 tablets (20 mg total) by mouth daily for 5 days. 07/25/20 07/30/20 Yes Walisiewicz, Glendoris Nodarse E, PA-C  acetaminophen (TYLENOL) 500 MG tablet Take 1,000 mg by mouth every 6 (six) hours as needed for headache.    [provider]  aspirin 81 MG tablet Take 81 mg by mouth daily.     [provider]  atenolol (TENORMIN) 25 MG tablet TAKE 1 TABLET BY MOUTH DAILY Patient taking differently: TAKE 1 TABLET BY MOUTH as needed 02/06/14   Schoenhoff, Altamese Cabal, MD  escitalopram (LEXAPRO) 20 MG tablet Take 20 mg by mouth daily.      [provider]  estradiol (EVAMIST) 1.53 MG/SPRAY transdermal spray Place 1 spray onto the skin daily.    [provider]  latanoprost (XALATAN) 0.005 % ophthalmic solution 1 drop at bedtime.    [provider]  LORazepam (ATIVAN) 1 MG tablet Take 1 mg by mouth daily as needed for anxiety.     [provider]  metoprolol succinate (TOPROL-XL) 25 MG 24 hr tablet TK 1 T PO QD 12/27/15   [provider]  Multiple Vitamin (MULTIVITAMIN WITH MINERALS) TABS tablet Take 1  tablet by mouth daily.    [provider]  nabumetone (RELAFEN) 500 MG tablet take 1 500mg  as needed for headache 03/12/16   [provider]  niacin 100 MG tablet Take 100 mg by mouth daily with breakfast.    [provider]  omeprazole (PRILOSEC) 20 MG capsule Take 20 mg by mouth daily.      [provider]  progesterone (PROMETRIUM) 200 MG capsule TK 1 C PO QD 02/23/16   [provider]  simvastatin (ZOCOR) 10 MG tablet TK 1 T PO QD IN THE EVE 03/12/16   [provider]  timolol (TIMOPTIC) 0.5 % ophthalmic solution INT 1 GTT IN OU BID 03/08/16   [provider]  traZODone (DESYREL) 100 MG tablet TK 1-3 TS PO QHS 02/23/16   [provider]    Allergies    Dorzolamide  Review of Systems   Review of Systems All other systems are reviewed and are negative for acute change except as noted in the HPI.  Physical Exam Updated Vital Signs BP (!) 144/63   Pulse 76   Temp 98 F (36.7 C) (Oral)   Resp 18   SpO2 94%   Physical Exam Vitals and nursing note reviewed.  Constitutional:      General: She is not in acute distress.    Appearance: She is not ill-appearing.  HENT:     Head: Normocephalic and atraumatic.     Comments: No tenderness to palpation of skull. No deformities or crepitus noted. No open wounds, abrasions or lacerations.   Airway is patent.  No angioedema.     Right Ear: Tympanic membrane and external ear normal.     Left Ear: Tympanic membrane and external ear normal.     Nose: Nose normal.     Mouth/Throat:     Mouth: Mucous membranes are moist.     Pharynx: Oropharynx is clear.  Eyes:     General: No scleral icterus.       Right eye: No discharge.        Left eye: No discharge.     Extraocular Movements: Extraocular movements intact.     Conjunctiva/sclera: Conjunctivae normal.     Pupils: Pupils are equal, round, and reactive to light.  Neck:     Vascular: No JVD.     Comments: Full ROM  intact without spinous process TTP. No bony stepoffs or deformities, no paraspinous muscle TTP or muscle spasms. No rigidity or meningeal signs. No bruising, erythema, or swelling.   Cardiovascular:     Rate and Rhythm: Normal rate and regular rhythm.     Pulses: Normal pulses.          Radial pulses are 2+ on the right side and 2+ on the left side.     Heart sounds: Normal heart sounds.  Pulmonary:     Comments: Lungs clear to auscultation in all fields. Symmetric chest rise. No wheezing, rales, or rhonchi. Abdominal:     Comments: Abdomen is soft, non-distended, and non-tender in all quadrants. No rigidity, no guarding. No peritoneal signs.  Musculoskeletal:        General: Normal range of motion.     Cervical back: Normal range of motion.     Comments: Compartments are soft in all extremities.  Moving all extremities without signs of injury.  No cervical, thoracic, or lumbar spinal tenderness to palpation.  No paraspinal tenderness. No step offs, crepitus or deformity palpated.    Skin:    General: Skin is warm and dry.     Capillary Refill: Capillary refill takes less than 2 seconds.     Findings: No rash.  Comments: Small circular area erythema noted to left upper back and left knee.  No foreign body seen.  No bleeding or purulent drainage, no open wound.  No red streaking.  Neurological:     Mental Status: She is oriented to person, place, and time.     GCS: GCS eye subscore is 4. GCS verbal subscore is 5. GCS motor subscore is 6.     Comments: Speech is clear and goal oriented, follows commands CN III-XII intact, no facial droop Normal strength in upper and lower extremities bilaterally including dorsiflexion and plantar flexion, strong and equal grip strength Sensation normal to light and sharp touch Moves extremities without ataxia, coordination intact Normal finger to nose and rapid alternating movements Normal gait and balance   No saddle anesthesia.    Psychiatric:        Behavior: Behavior normal.    ED Results / Procedures / Treatments   Labs (all labs ordered are listed, but only abnormal results are displayed) Labs Reviewed  CBC WITH DIFFERENTIAL/PLATELET - Abnormal; Notable for the following components:      Result Value   WBC 18.7 (*)    Neutro Abs 16.7 (*)    Abs Immature Granulocytes 0.13 (*)    All other components within normal limits  BASIC METABOLIC PANEL - Abnormal; Notable for the following components:   Potassium 3.2 (*)    Glucose, Bld 149 (*)    BUN 27 (*)    Calcium 8.8 (*)    All other components within normal limits  TROPONIN I (HIGH SENSITIVITY)    EKG EKG Interpretation  Date/Time:  Monday July 24 2020 16:37:30 EDT Ventricular Rate:  97 PR Interval:  164 QRS Duration: 73 QT Interval:  351 QTC Calculation: 446 R Axis:   63 Text Interpretation: Sinus rhythm Probable anteroseptal infarct, old Nonspecific T abnormalities, lateral leads since last tracing no significant change Confirmed by Daleen Bo [phone]) on 07/24/2020 5:53:23 PM  Radiology No results found.  Procedures Procedures   Medications Ordered in ED Medications  potassium chloride SA (KLOR-CON) CR tablet 40 mEq (40 mEq Oral Given 07/24/20 1911)    ED Course  I have reviewed the triage vital signs and the nursing notes.  Pertinent labs & imaging results that were available during my care of the patient were reviewed by me and considered in my medical decision making (see chart for details).    MDM Rules/Calculators/A&P                          History provided by patient and spouse with additional history obtained from chart review.    Presenting after anaphylaxis reaction when stung by yellow jackets.  Patient received epi, Solu-Medrol, Benadryl, Zofran and 1500 mL normal saline by EMS.  On my exam patient is resting comfortably.  No signs of angioedema, airway is patent.  She does not complain of a headache for EMS.  She is a  normal neuro exam.  No signs of head or neck injury.  No meningeal signs.  Has a history of headaches and this feels similar, no red flags for headache.  No saddle anesthesia, normal rectal tone, low suspicion for cauda equina as a result of the ground-level fall. It is likely related to anaphylaxis.  Orthostatic vital signs here are negative.  Patient is asymptomatic.  EKG without STEMI.  CBC with leukocytosis of 18.7, likely stress response.  No anemia, normal platelets.  BMP with mild  hypokalemia, potassium 3.2, glucose 149, BUN slightly elevated at 27 with a normal creatinine.  Patient given p.o. potassium replacement. Troponin collected as she was unsure if she had chest pain and is normal.  ACS felt to be unlikely.  Tolerating p.o. intake here.  Observed for 4 hours after epi administration.  She remains asymptomatic.    I have prescribed EpiPen as well as prednisone, Pepcid and Benadryl for her to take at home for this allergic reaction. Patient and spouse are agreeable with plan of care. Discussed HPI, physical exam and plan of care for this patient with attending Dr. Eulis Foster. The attending physician evaluated this patient as part of a shared visit and agrees with plan of care.   Portions of this note were generated with [employer]. Dictation errors may occur despite best attempts at proofreading.   Final Clinical Impression(s) / ED Diagnoses Final diagnoses:  Anaphylaxis, initial encounter    Rx / DC Orders ED Discharge Orders          Ordered    predniSONE (DELTASONE) 10 MG tablet  Daily        07/24/20 1820    EPINEPHrine 0.3 mg/0.3 mL IJ SOAJ injection  As needed        07/24/20 1800    famotidine (PEPCID) 20 MG tablet  Daily        07/24/20 1820    diphenhydrAMINE (BENADRYL) 25 MG tablet  Every 6 hours PRN        07/24/20 1820             Barrie Folk, PA-C 07/24/20 1919    Daleen Bo, MD 07/24/20 [phone]

## 2020-07-24 NOTE — ED Triage Notes (Signed)
Arrived via EMS, from home. Stated was stung by bee around 1430 and had syncope afterwards. EMS reported diaphoresis, nausea, hypotension. Denies allergic reaction hx in the past. Initial bp 72/38 improved after 1500 ml fluid bolus  to 136/64. Patient medicated with Epi 0.3 IM along w/ zofran, benadryl 50 mg IV and solumedrol 125 mg IV.

## 2020-07-24 NOTE — ED Provider Notes (Signed)
  Face-to-face evaluation   History: Patient was stung by several yellow jackets today while at home.  She began to feel weak, then lost consciousness, and EMS was contacted.  She was treated by EMS with EpiPen, with improvement of her clinical status.  She had a period of hypotension.  She lost consciousness for about 30 seconds.  No prior similar reaction although she has had allergic reaction from insect stings.  She denies chest pain, shortness of breath, focal weakness or paresthesia.  She is not having chest pain or headache.  Evaluation by me at 6:10 PM.  Physical exam: Alert, lucid and cooperative.  No respiratory distress.  No dysarthria or aphasia.  She is moving extremities easily.  She is eating without problems.  Medical screening examination/treatment/procedure(s) were conducted as a shared visit with non-physician practitioner(s) and myself.  I personally evaluated the patient during the encounter    Daleen Bo, MD 07/24/20 2335

## 2020-07-24 NOTE — Discharge Instructions (Signed)
-  Prescription for EpiPen sent to your pharmacy.  -Prescription for allergic reaction medications sent to your pharmacy including : Prednisone is a steroid.  Take as prescribed. Pepcid which is a histamine 2 blocker that helps after allergic reaction. Benadryl which is an antihistamine.  You take this one as needed for itching.  It can make you drowsy so do not drive or working when taking this.  Follow-up with your primary care doctor for recheck.  Return to the ER for any new or worsening symptoms.

## 2020-08-19 ENCOUNTER — Other Ambulatory Visit: Payer: Self-pay

## 2020-08-19 ENCOUNTER — Emergency Department (HOSPITAL_BASED_OUTPATIENT_CLINIC_OR_DEPARTMENT_OTHER)
Admission: EM | Admit: 2020-08-19 | Discharge: 2020-08-19 | Disposition: A | Discharge status: Home / Self Care | Payer: Medicare Other | Attending: Emergency Medicine | Admitting: Emergency Medicine

## 2020-08-19 ENCOUNTER — Emergency Department (HOSPITAL_BASED_OUTPATIENT_CLINIC_OR_DEPARTMENT_OTHER): Payer: Medicare Other

## 2020-08-19 ENCOUNTER — Encounter (HOSPITAL_BASED_OUTPATIENT_CLINIC_OR_DEPARTMENT_OTHER): Payer: Self-pay

## 2020-08-19 DIAGNOSIS — S52221A Displaced transverse fracture of shaft of right ulna, initial encounter for closed fracture: Secondary | ICD-10-CM | POA: Insufficient documentation

## 2020-08-19 DIAGNOSIS — S40022A Contusion of left upper arm, initial encounter: Secondary | ICD-10-CM | POA: Diagnosis not present

## 2020-08-19 DIAGNOSIS — T148XXA Other injury of unspecified body region, initial encounter: Secondary | ICD-10-CM

## 2020-08-19 DIAGNOSIS — I10 Essential (primary) hypertension: Secondary | ICD-10-CM | POA: Insufficient documentation

## 2020-08-19 DIAGNOSIS — Z79899 Other long term (current) drug therapy: Secondary | ICD-10-CM | POA: Diagnosis not present

## 2020-08-19 DIAGNOSIS — Z87891 Personal history of nicotine dependence: Secondary | ICD-10-CM | POA: Insufficient documentation

## 2020-08-19 DIAGNOSIS — M79631 Pain in right forearm: Secondary | ICD-10-CM | POA: Insufficient documentation

## 2020-08-19 DIAGNOSIS — S59911A Unspecified injury of right forearm, initial encounter: Secondary | ICD-10-CM | POA: Diagnosis present

## 2020-08-19 DIAGNOSIS — S52271A Monteggia's fracture of right ulna, initial encounter for closed fracture: Secondary | ICD-10-CM

## 2020-08-19 DIAGNOSIS — Z7982 Long term (current) use of aspirin: Secondary | ICD-10-CM | POA: Diagnosis not present

## 2020-08-19 MED ORDER — ONDANSETRON 4 MG PO TBDP
4.0000 mg | ORAL_TABLET | Freq: Once | ORAL | Status: AC
  Administered 2020-08-19: 4 mg via ORAL
  Filled 2020-08-19: qty 1

## 2020-08-19 MED ORDER — HYDROCODONE-ACETAMINOPHEN 5-325 MG PO TABS: 1.0000 | ORAL_TABLET | Freq: Four times a day (QID) | ORAL | 0 refills | Status: AC | PRN

## 2020-08-19 MED ORDER — HYDROCODONE-ACETAMINOPHEN 5-325 MG PO TABS
1.0000 | ORAL_TABLET | Freq: Once | ORAL | Status: AC
  Administered 2020-08-19: 1 via ORAL
  Filled 2020-08-19: qty 1

## 2020-08-19 NOTE — ED Notes (Signed)
Pt to Xray.

## 2020-08-19 NOTE — ED Notes (Signed)
Pt acuity changed d/t mechanism of injury
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
## 2020-08-19 NOTE — Discharge Instructions (Addendum)
Contact a health care provider if you have: Pain that does not get better with medicine. Swelling that gets worse. A bad smell coming from your cast. Get help right away if: You cannot move your fingers. You have severe pain. Your fingers or your hand: Become numb, cold, or pale. Turn a bluish color.
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
